# Patient Record
Sex: Female | Born: 1965 | Race: Black or African American | Hispanic: No | Smoking: Never smoker
Health system: Southern US, Community
[De-identification: ages and names within clinical notes are randomized; demographics above are authoritative.]

## PROBLEM LIST (undated history)

## (undated) DIAGNOSIS — R7303 Prediabetes: Secondary | ICD-10-CM

## (undated) HISTORY — PX: ACHILLES TENDON REPAIR: SUR1153

## (undated) HISTORY — PX: ABDOMINAL HYSTERECTOMY: SHX81

## (undated) HISTORY — PX: KNEE SURGERY: SHX244

---

## 1998-07-30 ENCOUNTER — Inpatient Hospital Stay (HOSPITAL_COMMUNITY): Admission: AD | Admit: 1998-07-30 | Discharge: 1998-07-30 | Payer: Self-pay | Admitting: *Deleted

## 1998-08-19 ENCOUNTER — Other Ambulatory Visit: Admission: RE | Admit: 1998-08-19 | Discharge: 1998-08-19 | Payer: Self-pay | Admitting: Obstetrics and Gynecology

## 1999-01-08 ENCOUNTER — Inpatient Hospital Stay (HOSPITAL_COMMUNITY): Admission: AD | Admit: 1999-01-08 | Discharge: 1999-01-11 | Payer: Self-pay | Admitting: Obstetrics and Gynecology

## 1999-01-24 ENCOUNTER — Inpatient Hospital Stay (HOSPITAL_COMMUNITY): Admission: AD | Admit: 1999-01-24 | Discharge: 1999-01-24 | Payer: Self-pay | Admitting: Obstetrics & Gynecology

## 2001-07-20 ENCOUNTER — Other Ambulatory Visit: Admission: RE | Admit: 2001-07-20 | Discharge: 2001-07-20 | Payer: Self-pay | Admitting: Obstetrics & Gynecology

## 2002-01-20 ENCOUNTER — Inpatient Hospital Stay (HOSPITAL_COMMUNITY): Admission: AD | Admit: 2002-01-20 | Discharge: 2002-01-20 | Payer: Self-pay | Admitting: Obstetrics and Gynecology

## 2002-01-22 ENCOUNTER — Inpatient Hospital Stay (HOSPITAL_COMMUNITY): Admission: AD | Admit: 2002-01-22 | Discharge: 2002-01-22 | Payer: Self-pay | Admitting: Obstetrics and Gynecology

## 2002-02-17 ENCOUNTER — Inpatient Hospital Stay (HOSPITAL_COMMUNITY): Admission: AD | Admit: 2002-02-17 | Discharge: 2002-02-20 | Payer: Self-pay | Admitting: Obstetrics & Gynecology

## 2002-02-17 ENCOUNTER — Encounter (INDEPENDENT_AMBULATORY_CARE_PROVIDER_SITE_OTHER): Payer: Self-pay

## 2002-04-08 ENCOUNTER — Inpatient Hospital Stay (HOSPITAL_COMMUNITY): Admission: AD | Admit: 2002-04-08 | Discharge: 2002-04-08 | Payer: Self-pay | Admitting: Obstetrics and Gynecology

## 2002-09-06 ENCOUNTER — Emergency Department (HOSPITAL_COMMUNITY): Admission: EM | Admit: 2002-09-06 | Discharge: 2002-09-06 | Payer: Self-pay | Admitting: Emergency Medicine

## 2007-01-05 ENCOUNTER — Observation Stay (HOSPITAL_COMMUNITY): Admission: EM | Admit: 2007-01-05 | Discharge: 2007-01-07 | Payer: Self-pay | Admitting: Emergency Medicine

## 2007-01-05 ENCOUNTER — Ambulatory Visit: Payer: Self-pay | Admitting: Internal Medicine

## 2008-02-16 ENCOUNTER — Emergency Department (HOSPITAL_BASED_OUTPATIENT_CLINIC_OR_DEPARTMENT_OTHER): Admission: EM | Admit: 2008-02-16 | Discharge: 2008-02-16 | Payer: Self-pay | Admitting: Emergency Medicine

## 2008-08-01 ENCOUNTER — Emergency Department (HOSPITAL_BASED_OUTPATIENT_CLINIC_OR_DEPARTMENT_OTHER): Admission: EM | Admit: 2008-08-01 | Discharge: 2008-08-01 | Payer: Self-pay | Admitting: Emergency Medicine

## 2009-10-25 ENCOUNTER — Other Ambulatory Visit: Admission: RE | Admit: 2009-10-25 | Discharge: 2009-10-25 | Payer: Self-pay | Admitting: Obstetrics and Gynecology

## 2009-10-25 ENCOUNTER — Ambulatory Visit: Payer: Self-pay | Admitting: Obstetrics and Gynecology

## 2009-10-25 ENCOUNTER — Encounter: Payer: Self-pay | Admitting: Obstetrics & Gynecology

## 2009-10-25 LAB — CONVERTED CEMR LAB
HCT: 35.8 % — ABNORMAL LOW (ref 36.0–46.0)
Hemoglobin: 10.2 g/dL — ABNORMAL LOW (ref 12.0–15.0)
MCHC: 28.5 g/dL — ABNORMAL LOW (ref 30.0–36.0)
MCV: 70.2 fL — ABNORMAL LOW (ref 78.0–100.0)
Platelets: 415 10*3/uL — ABNORMAL HIGH (ref 150–400)
RBC: 5.1 M/uL (ref 3.87–5.11)
RDW: 17.8 % — ABNORMAL HIGH (ref 11.5–15.5)
TSH: 1.073 microintl units/mL (ref 0.350–4.500)
WBC: 5.8 10*3/uL (ref 4.0–10.5)

## 2009-10-28 ENCOUNTER — Ambulatory Visit (HOSPITAL_COMMUNITY): Admission: RE | Admit: 2009-10-28 | Discharge: 2009-10-28 | Payer: Self-pay | Admitting: Obstetrics and Gynecology

## 2009-11-08 ENCOUNTER — Ambulatory Visit: Payer: Self-pay | Admitting: Obstetrics & Gynecology

## 2009-11-29 ENCOUNTER — Ambulatory Visit: Payer: Self-pay | Admitting: Obstetrics & Gynecology

## 2010-10-28 NOTE — Consult Note (Signed)
Lindsay Gonzalez, SUKUP NO.:  [id number]   MEDICAL RECORD NO.:  [id number]          PATIENT TYPE:  INP   LOCATION:  4736                         FACILITY:  [facility]   PHYSICIAN:  Bevelyn Buckles. Bensimhon, MDDATE OF BIRTH:  1966/02/26   DATE OF CONSULTATION:  01/06/2007  DATE OF DISCHARGE:                                 CONSULTATION   PRIMARY CARE PHYSICIAN:  Dr.  Ashley Royalty.   REQUESTING PHYSICIAN:  [facility].   CARDIOLOGIST:  Bevelyn Buckles. Bensimhon, M.D.   REASON FOR CONSULTATION:  Chest pain.   HISTORY OF PRESENT ILLNESS:  This is a 45 year old obese female with  complaints of chest pain ongoing on and off for the last 6 to 7 months,  which has gotten worse over the past 3 weeks.  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  She  felt weak and nauseated while she was standing doing someone's hair.  The patient states it lasted approximately 2 hours.  Apparently, this  patient has been having this same similar chest discomfort over the last  6 to 7 months that has been coming more often and lasting anywhere from  1 to 2 hours.  The patient states she felt nauseated.  Also, felt her  heart fluttering and her heart beating very fast and pounding in her  ears.  There is no aggravating or relieving factors.  Usually the pain  in the past has gone away on its own.  As a result of this recurrent  chest discomfort, the patient drove to [facility] emergency room.  She  was given 4 baby aspirin in the ER, placed on nitroglycerin paste and  the pain was relieved.  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  She has had no prior cardiac  workup in the past.  She denies any dizziness, vomiting, diarrhea,  syncopal episodes or diaphoresis.   REVIEW OF  SYSTEMS:  As above.  Otherwise, negative.   PAST MEDICAL HISTORY:  She did have a back injury in 1991.  There is no  prior history of diabetes, hypertension, hypercholesterolemia or kidney  disease.   PAST SURGICAL HISTORY:  Knee surgeries, left Achilles tendon repair and  3 C-sections.   SOCIAL HISTORY:  The patient lives in [place] with her 3 children.  She  is divorced.  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  She has  never smoked.  Occasional alcohol.  She walks the track on the weekends  with her children.   FAMILY HISTORY:  Mother with angina.  Father with diabetes,  hypertension, and kidney disease.  She has 1 sister with hypertension.   CURRENT MEDICATIONS AT HOME:  Ibuprofen p.r.n.   CURRENT MEDICATIONS WHILE HOSPITALIZED:  1. Protonix 40 mg once a day.  2. Nitroglycerin p.r.n.   ALLERGIES:  PENICILLIN.  CURRENT LABS:  Hemoglobin 8.4, hematocrit 28.3, white blood cells 5.4,  platelets 467, sodium 140, potassium 3.7, chloride 109, CO2 26, BUN 6,  creatinine 1.0, glucose 91, CK 10.5, MB 1.0, troponin 0.01 and 0.01,  respectively.  PT 13.1, INR 1.0, cholesterol 137, triglycerides 48, HDL  30, LDL 97.  CT of the chest was negative for PE dissection.  No acute  findings.  1 to 2 low density areas are involving the distal pancreas  with recommendations for follow up CT in 6 months.  Chest x-ray  revealing no acute abnormalities.  EKG revealing normal sinus rhythm  with ventricular rate of 72 beats per minute.   PHYSICAL EXAMINATION:  VITAL SIGNS:  Blood pressure 108/71, pulse 80,  respirations 24, saturation 100% on room air, temperature 97.0.  HEENT:  Head is normocephalic, atraumatic.  Eyes:  PERRLA.  Mucous  membranes of the mouth pink and moist.  Tongue is midline.  NECK:  Supple.  There is no JVD.  No carotid bruits.  No thyromegaly  noted.  CARDIOVASCULAR:  Regular rate and rhythm with S1 and S2 with 2+ equal  pulses.  No bruits appreciated.  LUNGS:   Clear to auscultation without wheezes, rales or rhonchi.  ABDOMEN:  Soft and nontender.  No rebound or guarding.  EXTREMITIES:  Without clubbing, cyanosis or edema.  MUSCULOSKELETAL:  No joint deformity, effusions, or tenderness.  NEURO:  Cranial nerves II-XII are grossly intact.   IMPRESSION:  1. Chest pain.  Cardiac enzymes are negative x2.  The chest pain is      typical and atypical with a negative EKG and enzymes.  2. Microcytic anemia.  Recommend iron studies with CBC with diff,      Hemoccult stools, rule out thalassemia, sickle cell, blood loss.      The patient does mention that she has heavy periods.   PLAN:  This is a 45 year old obese female we are following at the  request of [facility] for recurrent chest pain.  She was seen and  examined by myself and Dr. Arvilla Meres in her room.  The chest pain  is typical and atypical with negative EKG and enzymes.  Given her age  and lack of risk factors, suspicion for CAD is fairly low.  We will plan  an exercise stress test and Myoview in the morning for diagnostic  prognostic purposes.  She will need an anemia work up and we will follow  with you making further recommendations based upon test results and  patient's response to medications.      Bettey Mare. Lyman Bishop, NP      Bevelyn Buckles. Bensimhon, MD  Electronically Signed    KML/MEDQ  D:  01/06/2007  T:  01/06/2007  Job:  [id number]   cc:   Dr. Ashley Royalty

## 2010-10-28 NOTE — Discharge Summary (Signed)
Lindsay Gonzalez, Lindsay Gonzalez             ACCOUNT NO.:  [id number]   MEDICAL RECORD NO.:  [id number]          PATIENT TYPE:  INP   LOCATION:  4736                         FACILITY:  [facility]   PHYSICIAN:  Ladell Pier, M.D.   DATE OF BIRTH:  10/02/1965   DATE OF ADMISSION:  01/05/2007  DATE OF DISCHARGE:  01/07/2007                               DISCHARGE SUMMARY   DISCHARGE DIAGNOSES:  1. Chest pain with negative stress test.  2. Microcytic anemia, most likely secondary to chronic blood loss from      GYN etiology.  3. Gastroesophageal reflux disease.  4. Pancreatic cyst.  Patient to follow up with repeat CT scan or MR in      6 months.   DISCHARGE MEDICATIONS:  1. Prilosec OTC daily.  2. Iron sulfate b.i.d.   PROCEDURE:  She had a stress test done January 06, 2007.   CONSULTATIONS:  Cardiology, [place].   HISTORY OF PRESENT ILLNESS:  The patient is a 45 year old female that  was admitted with chest pain for 3 weeks.  She states that the pain  lasted for approximately 2 hours.  She states that she has been having  chest pain over the last 6-7 months.  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  She has no cardiac  history.  She denies any dizziness, vomiting, diarrhea, syncopal episode  or diaphoresis.   PAST MEDICAL HISTORY:  Per admission H&P.   FAMILY HISTORY:  Per admission H&P.   SOCIAL HISTORY:  Per admission H&P.   MEDICATIONS:  Per admission H&P.   ALLERGIES:  Per admission H&P.   REVIEW OF SYSTEMS:  Per admission H&P.   PHYSICAL EXAMINATION ON DISCHARGE:  VITAL SIGNS:  Temperature 97.6.  Pulse of 66.  Respirations 18.  Blood pressure 98/67.  Pulse oximetry  99% on room air.  HEENT:  Head is normocephalic, atraumatic.  Pupils reactive to light.  Throat without erythema.  CARDIOVASCULAR:  Regular rate and rhythm.  LUNGS:  Clear bilaterally.  ABDOMEN:   Positive bowel sounds.  EXTREMITIES:  Without edema.   HOSPITAL COURSE:  1. Chest pain:  Patient was admitted to the telemetry floor.      Observation for chest pain continued.  Cardiology was consulted and      she had a mild perfusion stress test that was normal.  She will      followup outpatient with Dr. Loreta Ave to rule out a gastrointestinal      etiology of her pain.  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  2. Microcytic anemia:  The patient does have microcytic anemia.  She      states that she does have very heavy menses with clots all the      time.  She thought she was anemic from that.  She has  not been to      the GYN secondary to lack of insurance.  Encouraged patient to      follow up with GYN and to start taking iron 325 mg twice daily.   DISCHARGE LABS:  Iron 17, TIBC 296, percent sat 6, ferritin up 1.  WBC  5.7, hemoglobin 8.5, platelets 425.  Folate 6.2.  Iron less than 10.  Heptoglobin 83.  Cardiac enzymes all normal.  Lipid profile:  Total  cholesterol 137, triglycerides 48, HDL 30, LDL 97.  D-dimer normal at  0.30.  Myoperfusion study showed negative for ischemia or fixed  perfusion defects.  LV systolic function of 69%.  CT angiogram of the  chest, showed no acute chest findings.  A small, 1 or 2 small low  density air involving the distal pancreas.  This may represent a small  cyst, but there are indeterminate.  Recommend a 6 month followup CT to  insure stability.      Ladell Pier, M.D.  Electronically Signed     N[place]/MEDQ  D:  01/07/2007  T:  01/07/2007  Job:  [id number]   cc:   Altha Harm, MD

## 2010-10-28 NOTE — H&P (Signed)
NAME:  Lindsay Gonzalez, ZAGAL NO.:  [id number]   MEDICAL RECORD NO.:  [id number]          PATIENT TYPE:  EMS   LOCATION:  MAJO                         FACILITY:  [facility]   PHYSICIAN:  Michaelyn Barter, M.D. DATE OF BIRTH:  1965-11-27   DATE OF ADMISSION:  01/05/2007  DATE OF DISCHARGE:                              HISTORY & PHYSICAL   PRIMARY CARE DOCTOR:  Unassigned.   CHIEF COMPLAINT:  Chest pain.   HISTORY OF PRESENT ILLNESS:  The patient is a 45 year old female who  states that over the past 6 months she has been experiencing chest pain.  This morning at approximately 9 o'clock a.m. while at work, she works at  [employer], she was standing up doing a customer's hair when she  began to develop chest pain.  The pain is centrally located.  She  describes it as a stabbing pressure reaching approximately 7/10.  She  states that the pain occurs almost every day, however, today she has  been feeling a bit more weak than previously.  There is occasional  radiation of the chest pain to her back.  She denies having any  radiation to her neck or down her arm.  There are no aggravating or  alleviating factors for the patient's chest pain.  The pain usually  remits spontaneously.  Today it has been occurring off and on throughout  the the day.  She denies presently having shortness of breath.  She did  have some nausea but denies emesis.  No fevers or chills.  No orthopnea,  no PND.  She does snore.  She has never had a stress test before.  Currently she denies having any chest pain per se but states that she  feels a fullness in her chest.   PAST MEDICAL HISTORY:  No illnesses.   PAST SURGICAL HISTORY:  1. Three left knee surgeries.  2. Left Achilles tendon surgery.  3. Three c-sections.   ALLERGIES:  PENICILLIN causes swelling.   HOME MEDICATIONS:  Ibuprofen p.r.n.   SOCIAL HISTORY:  1. Cigarettes never.  2. Alcohol occasionally.  3. Cocaine never.  4. Employed  as a Interior and spatial designer.   FAMILY HISTORY:  Mother had questionable angina, father has diabetes  mellitus and hypertension.   PHYSICAL EXAM:  The patient is awake, she is cooperative, she is no  obvious respiratory distress.  VITALS:  Her blood pressure is 148/75, heart rate 79, respirations 18,  pulse ox 98%.  HEENT:  Normocephalic, atraumatic, anicteric, extraocular movements are  intact.  Pupils are equally reactive to light bilaterally.  Oral mucosa  is pink, no thrush, no exudate.  NECK:  Supple, neck veins are slightly prominent, no lymphadenopathy, no  thyromegaly.  Carotid upstroke is strong bilaterally, no carotid bruit.  CARDIAC:  S1, S2 present, regular rate and rhythm although heart sounds  are somewhat distant.  RESPIRATORY:  No crackles, no wheezes.  CHEST:  There is no reproducible chest pain to palpation.  ABDOMEN:  Flat, soft, nontender, nondistended, no masses, no  hepatosplenomegaly.  Bowel sounds are present x4 quadrants.  EXTREMITIES:  No leg edema.  NEUROLOGICALLY:  The patient is alert and oriented x3.  Cranial nerves  II-XII are intact.  MUSCULOSKELETAL:  5/5 upper and lower extremity strength.   LABS:  Troponin-I, POC less than 0.05 x3.  CKMB, POC less than 1 x3.  D-  dimer 0.30, PT 13.1, INR 1.0, creatinine 1.0, pH of 7.341, PCO2 49.7,  bicarb 26.9, hemoglobin 13.6, hematocrit 40.0, sodium 141, potassium  3.8, chloride 106, glucose 95, BUN 8.  EKG reveals normal sinus rhythm,  no Q-waves or ST segment changes.  Chest x-ray reveals no acute  abnormalities.   ASSESSMENT AND PLAN:  1. Chest pain.  The patient's current chest pain does have some      atypical features in that it has been occurring every single day      for the past 6 months.  The etiology of which however is      questionable.  Whether or not this is cardiac versus an other cause      is also questionable, although the patient's risk factors appear to      be limited in that she denies cigarettes,  there is no family      history of myocardial infarction.  Will rule the patient out  for a      cardiac eventy by checking troponin-I and CKMBs x3.  Will also      check a 2D echocardiogram as well as a fasting lipid profile.  Will      provide the patient with as needed morphine, oxygen, aspirin and      nitroglycerin.  In light of the patient indicating that the pain      travels directly to her back, will get a CT scan of the patient's      chest to make sure that the patient does not have a dissection      present.  2. Gastrointestinal prophylaxis.  Will provide Protonix.  3. Deep venous thrombosis prophylaxis.  Will provide Lovenox.      Michaelyn Barter, M.D.  Electronically Signed     OR/MEDQ  D:  01/05/2007  T:  01/05/2007  Job:  [id number]

## 2010-10-31 NOTE — Op Note (Signed)
NAME:  Lindsay Gonzalez, Lindsay Gonzalez                       ACCOUNT NO.:  [id number]   MEDICAL RECORD NO.:  [id number]                   PATIENT TYPE:  INP   LOCATION:  9124                                 FACILITY:  [facility]   PHYSICIAN:  Ilda Mori, M.D.                DATE OF BIRTH:  [birth year]   DATE OF PROCEDURE:  02/17/2002  DATE OF DISCHARGE:                                 OPERATIVE REPORT   PREOPERATIVE DIAGNOSES:  Previous cesarean section x2.   POSTOPERATIVE DIAGNOSES:  Previous cesarean section x2, term birth living  female child, partial dehiscence of cesarean section scar in lower segment.   PROCEDURE:  Repeat low transverse cesarean section, bilateral partial  salpingectomy for sterilization.   SURGEON:  Ilda Mori, M.D.   ASSISTANT:  Randye Lobo, M.D.   ANESTHESIA:  Spinal.   ESTIMATED BLOOD LOSS:  500 cc.   FINDINGS:  Female infant weighing 7 pounds 7 ounces.  Apgar scores 9 and 9.  Almost total dehiscence of lower segment scar with fetal parts visible  through the window in the lower segment.   INDICATIONS:  This is a 45 year old gravida 5, para 2, AB2 who was admitted  for repeat low transverse cesarean section.  The patient's prenatal course  was uncomplicated.  At her 37 week visit she was scheduled for surgery and  at that point she requested permanent sterilization.  On admission to the  hospital she had decided not to have her tubes tied.   PROCEDURE:  The patient was taken to the operating room and the abdomen was  prepped and spinal anesthesia was placed.  The abdomen was then prepped and  draped in a sterile fashion.  A low transverse incision was made through the  previous laparotomy scar.  The incision was carried down through the fascia  and the anterior rectus sheath was dissected free from the underlying rectus  muscle.  The rectus muscle was then divided in the midline and the  peritoneum was entered sharply and extended by sharp and blunt  dissection.  The lower segment was identified and a window was noted to be present with  the fetal hand could be seen through the lower segment window.  Metzenbaum  scissors were used to enter the uterine cavity and the lower segment was  extended bluntly in a transverse fashion.  The infant was delivered with the  aid of a vacuum extractor.  A single nuchal cord was noted.  Cord bloods  were obtained.  The placenta was delivered with traction on the cord.  The  membranes were removed with blunt curettage.  The lower segment which was  paper thin was closed with running interlocking 0 Vicryl suture.  At this  point the patient really had no sedation and was well alert and was informed  of the window that had been noted in the lower segment and the risk of  rupture in  subsequent pregnancies.  In view of the fact that the patient had  previously expressed the wish for tubal ligation, that she had two living  children at the time plus an apparently healthy baby at the time of this  delivery, the option of having her tubes tied was revisited.  This was done  mainly for medical indications with the fear of maternal and fetal problems  if the patient elected to attempt carrying another pregnancy to term.  At  that time it was also discussed with the patient that birth control pills,  Depo-Provera shots, and oral contraceptives were all forms of nonpermanent  birth control that could be used.  The patient at this point elected to  proceed with tubal ligation.  The anesthetist and circulating nurse were  informed.  They all felt comfortable, as well, with the decision to proceed  with sterilization as being appropriate based upon the surgical findings and  the informed consent given to the patient and the patient's response.  Therefore, at this time the fallopian tubes were grasped with Babcock clamp  and doubly ligated with 0 plain catgut suture and a knuckle of tube was  excised for pathological  confirmation.  The peritoneum and rectus muscle was  closed in the midline with running 3-0 Vicryl suture.  The fascia was closed  in a transverse fashion with 0 Vicryl suture.  The scar tissue and  subcutaneous tissue was opened with cutting cautery and the skin as closed  with staples.  The patient tolerated the procedure well and left the  operating room in good condition.                                               Ilda Mori, M.D.    RK/MEDQ  D:  02/17/2002  T:  02/17/2002  Job:  [id number]

## 2011-03-30 LAB — POCT I-STAT CREATININE
Creatinine, Ser: 1
Operator id: 285841

## 2011-03-30 LAB — IRON AND TIBC
Iron: 17 — ABNORMAL LOW
Saturation Ratios: 6 — ABNORMAL LOW
TIBC: 296
UIBC: 279

## 2011-03-30 LAB — CBC
HCT: 28.3 — ABNORMAL LOW
HCT: 28.8 — ABNORMAL LOW
Hemoglobin: 8.4 — ABNORMAL LOW
Hemoglobin: 8.5 — ABNORMAL LOW
MCHC: 29.5 — ABNORMAL LOW
MCHC: 29.6 — ABNORMAL LOW
MCV: 61.3 — ABNORMAL LOW
MCV: 61.8 — ABNORMAL LOW
Platelets: 425 — ABNORMAL HIGH
Platelets: 467 — ABNORMAL HIGH
RBC: 4.62
RBC: 4.66
RDW: 18.6 — ABNORMAL HIGH
RDW: 19.1 — ABNORMAL HIGH
WBC: 5.4
WBC: 5.7

## 2011-03-30 LAB — LIPID PANEL
Cholesterol: 137
HDL: 30 — ABNORMAL LOW
LDL Cholesterol: 97
Total CHOL/HDL Ratio: 4.6
Triglycerides: 48
VLDL: 10

## 2011-03-30 LAB — POCT CARDIAC MARKERS
CKMB, poc: <1 — ABNORMAL LOW
CKMB, poc: <1 — ABNORMAL LOW
CKMB, poc: <1 — ABNORMAL LOW
Myoglobin, poc: 50.1
Myoglobin, poc: 55.5
Myoglobin, poc: 60.8
Operator id: 272551
Operator id: 285841
Operator id: 285841
Troponin i, poc: <0.05
Troponin i, poc: <0.05
Troponin i, poc: <0.05

## 2011-03-30 LAB — I-STAT 8, (EC8 V) (CONVERTED LAB)
BUN: 8
Bicarbonate: 26.9 — ABNORMAL HIGH
Chloride: 106
Glucose, Bld: 95
HCT: 40
Hemoglobin: 13.6
Operator id: 285841
Potassium: 3.8
Sodium: 141
TCO2: 28
pCO2, Ven: 49.7
pH, Ven: 7.341 — ABNORMAL HIGH

## 2011-03-30 LAB — FOLATE: Folate: 6.2

## 2011-03-30 LAB — BASIC METABOLIC PANEL
BUN: 6
CO2: 26
Calcium: 8.6
Chloride: 109
Creatinine, Ser: 1.06
GFR calc Af Amer: >60
GFR calc non Af Amer: 57 — ABNORMAL LOW
Glucose, Bld: 91
Potassium: 3.7
Sodium: 140

## 2011-03-30 LAB — PROTIME-INR
INR: 1
Prothrombin Time: 13.1

## 2011-03-30 LAB — DIFFERENTIAL
Basophils Absolute: 0
Basophils Relative: 1
Eosinophils Absolute: 0.1
Eosinophils Relative: 1
Lymphocytes Relative: 42
Lymphs Abs: 2.4
Monocytes Absolute: 0.4
Monocytes Relative: 8
Neutro Abs: 2.8
Neutrophils Relative %: 49

## 2011-03-30 LAB — HAPTOGLOBIN: Haptoglobin: 83

## 2011-03-30 LAB — CARDIAC PANEL(CRET KIN+CKTOT+MB+TROPI)
CK, MB: 0.9
CK, MB: 0.9
Relative Index: INVALID
Relative Index: INVALID
Total CK: 93
Total CK: 96
Troponin I: 0.01
Troponin I: <0.01

## 2011-03-30 LAB — CK TOTAL AND CKMB (NOT AT ARMC)
CK, MB: 1
Relative Index: 1
Total CK: 105

## 2011-03-30 LAB — FERRITIN: Ferritin: 1 — ABNORMAL LOW (ref 10–291)

## 2011-03-30 LAB — D-DIMER, QUANTITATIVE (NOT AT ARMC): D-Dimer, Quant: 0.3

## 2011-03-30 LAB — IRON: Iron: <10 — ABNORMAL LOW

## 2011-03-30 LAB — TROPONIN I: Troponin I: <0.01

## 2011-10-17 ENCOUNTER — Other Ambulatory Visit: Payer: Self-pay | Admitting: Family

## 2013-01-23 ENCOUNTER — Other Ambulatory Visit: Payer: Self-pay | Admitting: Family

## 2013-09-03 ENCOUNTER — Other Ambulatory Visit: Payer: Self-pay | Admitting: Family

## 2013-09-03 MED ORDER — INTEGRA F 125-1 MG PO CAPS
1.0000 | ORAL_CAPSULE | Freq: Every day | ORAL | Status: DC
Start: 2013-09-03 — End: 2019-10-30

## 2014-06-25 ENCOUNTER — Ambulatory Visit: Payer: Self-pay | Admitting: Obstetrics & Gynecology

## 2014-07-24 ENCOUNTER — Other Ambulatory Visit: Payer: Self-pay | Admitting: Family

## 2014-11-25 ENCOUNTER — Other Ambulatory Visit: Payer: Self-pay | Admitting: Family

## 2014-11-25 MED ORDER — ACYCLOVIR 200 MG PO CAPS: ORAL_CAPSULE | ORAL | Status: DC

## 2014-11-25 NOTE — Progress Notes (Signed)
Pt called regarding outbreak of herpes lesions and desires medication called in.  RX Acyclovir sent to [facility].

## 2017-09-26 ENCOUNTER — Emergency Department (HOSPITAL_BASED_OUTPATIENT_CLINIC_OR_DEPARTMENT_OTHER): Payer: Medicaid Other

## 2017-09-26 ENCOUNTER — Other Ambulatory Visit: Payer: Self-pay

## 2017-09-26 ENCOUNTER — Encounter (HOSPITAL_BASED_OUTPATIENT_CLINIC_OR_DEPARTMENT_OTHER): Payer: Self-pay | Admitting: *Deleted

## 2017-09-26 ENCOUNTER — Emergency Department (HOSPITAL_BASED_OUTPATIENT_CLINIC_OR_DEPARTMENT_OTHER)
Admission: EM | Admit: 2017-09-26 | Discharge: 2017-09-26 | Disposition: A | Discharge status: Home / Self Care | Payer: Medicaid Other | Attending: Emergency Medicine | Admitting: Emergency Medicine

## 2017-09-26 DIAGNOSIS — M79601 Pain in right arm: Secondary | ICD-10-CM | POA: Insufficient documentation

## 2017-09-26 DIAGNOSIS — M25521 Pain in right elbow: Secondary | ICD-10-CM | POA: Diagnosis not present

## 2017-09-26 DIAGNOSIS — M7989 Other specified soft tissue disorders: Secondary | ICD-10-CM | POA: Diagnosis not present

## 2017-09-26 DIAGNOSIS — Y9389 Activity, other specified: Secondary | ICD-10-CM | POA: Insufficient documentation

## 2017-09-26 DIAGNOSIS — Z79899 Other long term (current) drug therapy: Secondary | ICD-10-CM | POA: Diagnosis not present

## 2017-09-26 DIAGNOSIS — X501XXA Overexertion from prolonged static or awkward postures, initial encounter: Secondary | ICD-10-CM | POA: Insufficient documentation

## 2017-09-26 DIAGNOSIS — Y9289 Other specified places as the place of occurrence of the external cause: Secondary | ICD-10-CM | POA: Diagnosis not present

## 2017-09-26 DIAGNOSIS — Y999 Unspecified external cause status: Secondary | ICD-10-CM | POA: Diagnosis not present

## 2017-09-26 MED ORDER — METHOCARBAMOL 500 MG PO TABS: 500.0000 mg | ORAL_TABLET | Freq: Two times a day (BID) | ORAL | 0 refills | Status: DC

## 2017-09-26 NOTE — ED Provider Notes (Signed)
[facility] EMERGENCY DEPARTMENT Provider Note   CSN: [id number] Arrival date & time: 09/26/17  1856     History   Chief Complaint Chief Complaint  Patient presents with  . Arm Pain    HPI Lindsay Gonzalez is a 52 y.o. female who presents for evaluation of right arm pain times 2 weeks.  Patient reports that she is having pain to the medial aspect of the right elbow.  She denies any preceding trauma, injury, fall.  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  Patient reports that she has not noticed any redness or swelling overlying the arm.  She reports pain is worse with movement but is able to move it without difficulty.  Patient has not had any fevers.  Patient not taking any medications for the pain.  Patient denies any fevers, numbness/weakness, color change, warmth, SOB, CP.   The history is provided by the patient.    History reviewed. No pertinent past medical history.  There are no active problems to display for this patient.   Past Surgical History:  Procedure Laterality Date  . ABDOMINAL HYSTERECTOMY    . ACHILLES TENDON REPAIR Left   . CESAREAN SECTION    . KNEE SURGERY     x 3. left knee.     OB History   None      Home Medications    Prior to Admission medications   Medication Sig Start Date End Date Taking? Authorizing Provider  acyclovir (ZOVIRAX) 200 MG capsule Take two tablets three times a day for 5 days for each outbreak. 11/25/14   Marlis Edelson, CNM  Fe Fum-FePoly-FA-Vit C-Vit B3 (INTEGRA F) 125-1 MG CAPS Take 1 tablet by mouth daily. 09/03/13   Marlis Edelson, CNM  methocarbamol (ROBAXIN) 500 MG tablet Take 1 tablet (500 mg total) by mouth 2 (two) times daily. 09/26/17   Maxwell Caul, PA-C    Family History History reviewed. No pertinent family history.  Social History Social History   Tobacco Use  . Smoking status: Never Smoker    Substance Use Topics  . Alcohol use: Not Currently  . Drug use: Not Currently     Allergies   Penicillins   Review of Systems Review of Systems  Constitutional: Negative for fever.  Musculoskeletal:       Right arm pain  Skin: Negative for color change and wound.  Neurological: Negative for weakness and numbness.     Physical Exam Updated Vital Signs BP 125/79 (BP Location: Left Arm)   Pulse 74   Temp 98.1 F (36.7 C) (Oral)   Resp 20   Ht 5\' 11"  (1.803 m)   Wt 113.4 kg (250 lb)   SpO2 100%   BMI 34.87 kg/m   Physical Exam  Constitutional: She appears well-developed and well-nourished.  HENT:  Head: Normocephalic and atraumatic.  Eyes: Conjunctivae and EOM are normal. Right eye exhibits no discharge. Left eye exhibits no discharge. No scleral icterus.  Cardiovascular:  Pulses:      Radial pulses are 2+ on the right side, and 2+ on the left side.  Pulmonary/Chest: Effort normal.  Musculoskeletal:  Tenderness to palpation noted to the medial aspect of the right elbow.  There is some mild overlying soft tissue swelling that is localized just to the area.  No deformity or crepitus noted.  Flexion/extension of right elbow intact without any difficulty.  No overlying warmth, erythema.  Right upper extremity is without any significant edema, erythema, warmth.  No tenderness palpation to right wrist, right shoulder.  Patient able to flex and extend wrist without any difficulty.  Good range of motion of right shoulder without any difficulty.  No abnormalities of the left upper extremity.  Neurological: She is alert.  Sensation intact along major nerve distributions of BUE  Skin: Skin is warm and dry. Capillary refill takes less than 2 seconds.  Good distal cap refill.  RUE is not dusky in appearance or cool to touch.  Psychiatric: She has a normal mood and affect. Her speech is normal and behavior is normal.  Nursing note and vitals reviewed.    ED Treatments / Results   Labs (all labs ordered are listed, but only abnormal results are displayed) Labs Reviewed - No data to display  EKG None  Radiology Dg Elbow Complete Right  Result Date: 09/26/2017 CLINICAL DATA:  Pain EXAM: RIGHT ELBOW - COMPLETE 3+ VIEW COMPARISON:  None. FINDINGS: Frontal, lateral, and bilateral oblique views were obtained. No fracture or dislocation. No joint effusion. Probable calcific tendinosis noted along the medial distal humeral condyle. There is mild spurring along the coracoid process of the proximal ulna. 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 Mild spurring along the coracoid process of the proximal ulna. No appreciable joint space narrowing or erosion. No fracture or dislocation. Electronically Signed   By: Bretta Bang III M.D.   On: 09/26/2017 22:00   Dg Forearm Right  Result Date: 09/26/2017 CLINICAL DATA:  Pain EXAM: RIGHT FOREARM - 2 VIEW COMPARISON:  None. FINDINGS: Frontal and lateral views were obtained. No evident fracture or dislocation. No appreciable joint space narrowing or erosion. There is a minus ulnar variance. There is calcification along the medial distal humeral condyle, probably indicative of calcific tendinosis. IMPRESSION: Probable calcific tendinosis along the distal medial humeral condyle. No fracture or dislocation. No joint space narrowing. Minus ulnar variance noted. Electronically Signed   By: Bretta Bang III M.D.   On: 09/26/2017 21:58    Procedures Procedures (including critical care time)  Medications Ordered in ED Medications - No data to display   Initial Impression / Assessment and Plan / ED Course  I have reviewed the triage vital signs and the nursing notes.  Pertinent labs & imaging results that were available during my care of the patient were reviewed by me and considered in my medical decision making (see chart for details).     52 y.o. F who presents fro evaluation of RUE  pain that began 2 weeks ago. Reports pain worse with movement.  No preceding trauma, injury, fall.  Patient reports that pain is worse in the medial aspect of the right elbow.  She has noticed some mild soft tissue swelling.  No warmth, erythema.  No fevers, chest pain, difficulty breathing.  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 Patient is afebrile, non-toxic appearing, sitting comfortably on examination table. Vital signs reviewed and stable. Patient is neurovascularly intact.  On exam, patient has tenderness palpation noted to the medial aspect of the left elbow with localized soft tissue swelling.  No overlying warmth, erythema.  No deformity or crepitus noted.  Flexion/extension intact.  Right upper extremities without any global erythema, edema, warmth.  Consider tendinitis versus musculoskeletal strain versus epicondylitis.  History/physical exam is  not concerning for septic arthritis, DVT of upper extremity, acute arterial embolism.  Plan for x-ray evaluation.  X-ray reviewed.  Elbow x-ray shows calcific tendinosis along the medial distal humeral condyle.  Mild spurring noted.  No fracture or dislocation.  Forearm x-ray negative for any acute abnormalities.  I discussed results with patient.  Symptoms could be likely result of tendinitis versus epicondylitis.  We will plan to treat with supportive cares.  Patient instructed follow-up with primary care doctor in the next 2-4 days for further evaluation. Patient had ample opportunity for questions and discussion. All patient's questions were answered with full understanding. Strict return precautions discussed. Patient expresses understanding and agreement to plan.    Final Clinical Impressions(s) / ED Diagnoses   Final diagnoses:  Right arm pain    ED Discharge Orders        Ordered    methocarbamol (ROBAXIN) 500 MG tablet  2 times daily      09/26/17 2233       Rosana HoesLayden, Ziana Heyliger A, PA-C 09/27/17 0106    Tilden Fossaees, Elizabeth, MD 09/27/17 0130

## 2017-09-26 NOTE — ED Triage Notes (Signed)
Right arm pain without injury x 2 weeks.  Reports wrist and elbow pain-increased with movement.

## 2017-09-26 NOTE — Discharge Instructions (Signed)
You can take Tylenol or Ibuprofen as directed for pain. You can alternate Tylenol and Ibuprofen every 4 hours. If you take Tylenol at 1pm, then you can take Ibuprofen at 5pm. Then you can take Tylenol again at 9pm.   Take Robaxin as prescribed. This medication will make you drowsy so do not drive or drink alcohol when taking it.  Wear the sling for support and stabilization.  When you are at home make sure you are elevating and icing the arm.  [facility] for [facility].  Return to the emergency department for any fevers, redness or swelling of the arm, numbness/weakness of the arm, fevers or any other worsening or concerning symptoms.

## 2017-09-26 NOTE — ED Notes (Signed)
Pt given d/c instructions as per chart. Rx x 1 with precautions. Verbalizes understanding. No questions. 

## 2017-10-29 ENCOUNTER — Other Ambulatory Visit: Payer: Self-pay

## 2017-10-29 ENCOUNTER — Emergency Department (HOSPITAL_BASED_OUTPATIENT_CLINIC_OR_DEPARTMENT_OTHER)
Admission: EM | Admit: 2017-10-29 | Discharge: 2017-10-29 | Disposition: A | Discharge status: Home / Self Care | Payer: Medicaid Other | Attending: Emergency Medicine | Admitting: Emergency Medicine

## 2017-10-29 ENCOUNTER — Encounter (HOSPITAL_BASED_OUTPATIENT_CLINIC_OR_DEPARTMENT_OTHER): Payer: Self-pay | Admitting: *Deleted

## 2017-10-29 ENCOUNTER — Emergency Department (HOSPITAL_BASED_OUTPATIENT_CLINIC_OR_DEPARTMENT_OTHER): Payer: Medicaid Other

## 2017-10-29 DIAGNOSIS — J069 Acute upper respiratory infection, unspecified: Secondary | ICD-10-CM

## 2017-10-29 DIAGNOSIS — R059 Cough, unspecified: Secondary | ICD-10-CM

## 2017-10-29 DIAGNOSIS — R0981 Nasal congestion: Secondary | ICD-10-CM | POA: Diagnosis not present

## 2017-10-29 DIAGNOSIS — R05 Cough: Secondary | ICD-10-CM | POA: Diagnosis present

## 2017-10-29 DIAGNOSIS — Z79899 Other long term (current) drug therapy: Secondary | ICD-10-CM | POA: Diagnosis not present

## 2017-10-29 DIAGNOSIS — B9789 Other viral agents as the cause of diseases classified elsewhere: Secondary | ICD-10-CM | POA: Diagnosis not present

## 2017-10-29 MED ORDER — BENZONATATE 100 MG PO CAPS: 100.0000 mg | ORAL_CAPSULE | Freq: Three times a day (TID) | ORAL | 0 refills | Status: DC | PRN

## 2017-10-29 NOTE — ED Triage Notes (Signed)
Pt reports cough x4 days (productive cough), unsure of fevers. Has been taking Mucinex (last 3 hours ago) without relief.

## 2017-10-29 NOTE — Discharge Instructions (Signed)
It was my pleasure taking care of you today!   Your symptoms are likely due to a viral upper respiratory infection. Fortunately, we did not see evidence of serious infection and can treat your symptoms. Flonase and mucinex for nasal congestion, tessalon as needed for cough.  Rest, drink plenty of fluids to be sure you are staying hydrated.   Please follow up with your primary doctor for discussion of your diagnoses and further evaluation after today's visit if symptoms persist longer than 7 days; Return to the ER for high fevers, difficulty breathing or other concerning symptoms

## 2017-10-29 NOTE — ED Notes (Signed)
Patient transported to X-ray 

## 2017-10-30 NOTE — ED Provider Notes (Signed)
[facility] EMERGENCY DEPARTMENT Provider Note   CSN: [id number] Arrival date & time: 10/29/17  2139     History   Chief Complaint Chief Complaint  Patient presents with  . Cough    HPI Lindsay Gonzalez is a 52 y.o. female.  The history is provided by the patient and medical records. No language interpreter was used.  Cough  Pertinent negatives include no chest pain, no chills, no ear pain, no sore throat, no shortness of breath and no wheezing.  Lindsay Gonzalez is a 52 y.o. female who presents to the Emergency Department complaining of intermittently productive cough for the last 4 days.  Associated with nasal congestion.  She has been taking Mucinex with little improvement.  No fever or chills.  No known sick contacts.  Does feel like she has had allergies several days prior.  No chest pain or shortness of breath.   History reviewed. No pertinent past medical history.  There are no active problems to display for this patient.   Past Surgical History:  Procedure Laterality Date  . ABDOMINAL HYSTERECTOMY    . ACHILLES TENDON REPAIR Left   . CESAREAN SECTION    . KNEE SURGERY     x 3. left knee.     OB History   None      Home Medications    Prior to Admission medications   Medication Sig Start Date End Date Taking? Authorizing Provider  Fexofenadine HCl (MUCINEX ALLERGY PO) Take by mouth.   Yes [provider]  acyclovir (ZOVIRAX) 200 MG capsule Take two tablets three times a day for 5 days for each outbreak. 11/25/14   Marlis Edelson, CNM  benzonatate (TESSALON) 100 MG capsule Take 1 capsule (100 mg total) by mouth 3 (three) times daily as needed for cough. 10/29/17   Ward, Chase Picket, PA-C  Fe Fum-FePoly-FA-Vit C-Vit B3 (INTEGRA F) 125-1 MG CAPS Take 1 tablet by mouth daily. 09/03/13   Marlis Edelson, CNM  methocarbamol (ROBAXIN) 500 MG tablet Take 1 tablet (500 mg total) by mouth 2 (two) times daily. 09/26/17   Maxwell Caul, PA-C    Family History No family history on file.  Social History Social History   Tobacco Use  . Smoking status: Never Smoker  Substance Use Topics  . Alcohol use: Not Currently  . Drug use: Not Currently     Allergies   Penicillins   Review of Systems Review of Systems  Constitutional: Negative for chills and fever.  HENT: Positive for congestion. Negative for ear pain and sore throat.   Respiratory: Positive for cough. Negative for shortness of breath and wheezing.   Cardiovascular: Negative for chest pain.  Gastrointestinal: Negative for abdominal pain, diarrhea, nausea and vomiting.     Physical Exam Updated Vital Signs BP 106/79 (BP Location: Right Arm)   Pulse 92   Temp 98.2 F (36.8 C) (Oral)   Resp 18   Ht  (1.803 m)   Wt 113.4 kg (250 lb)   SpO2 98%   BMI 34.87 kg/m   Physical Exam  Constitutional: She is oriented to person, place, and time. She appears well-developed and well-nourished. No distress.  HENT:  Head: Normocephalic and atraumatic.  OP with erythema, no exudates or tonsillar hypertrophy. + nasal congestion with mucosal edema.   Neck: Normal range of motion. Neck supple.  Cardiovascular: Normal rate, regular rhythm and normal heart sounds.  Pulmonary/Chest: Effort normal.  Lungs are clear to  auscultation bilaterally - no w/r/r  Abdominal: Soft. She exhibits no distension. There is no tenderness.  Musculoskeletal: Normal range of motion.  Neurological: She is alert and oriented to person, place, and time.  Skin: Skin is warm and dry. She is not diaphoretic.  Nursing note and vitals reviewed.    ED Treatments / Results  Labs (all labs ordered are listed, but only abnormal results are displayed) Labs Reviewed - No data to display  EKG None  Radiology Dg Chest 2 View  Result Date: 10/29/2017 CLINICAL DATA:  Cough x 4 days w/ chest soreness. Non smoker. No htn or diab. Recent sinusitis. EXAM: CHEST - 2 VIEW COMPARISON:   Chest x-ray dated 01/05/2007 FINDINGS: Heart size and mediastinal contours are normal. Lungs are clear. No pleural effusion or pneumothorax seen. Osseous structures about the chest are unremarkable. IMPRESSION: No active cardiopulmonary disease. No evidence of pneumonia or pulmonary edema. Electronically Signed   By: Bary Richard M.D.   On: 10/29/2017 22:39    Procedures Procedures (including critical care time)  Medications Ordered in ED Medications - No data to display   Initial Impression / Assessment and Plan / ED Course  I have reviewed the triage vital signs and the nursing notes.  Pertinent labs & imaging results that were available during my care of the patient were reviewed by me and considered in my medical decision making (see chart for details).     Lindsay Gonzalez is a 52 y.o. female who presents to ED for cough, congestion.   On exam, patient is afebrile, non-toxic appearing with a clear lung exam. Mild rhinorrhea and OP with erythema but no exudates or tonsillar hypertrophy.  CXR with no acute findings.  Sxs today likely due to viral URI.Symptomatic home care instructions discussed. Rx for Tessalon given. PCP follow up strongly encouraged if symptoms persist. Reasons to return to ER discussed. All questions answered.   Blood pressure 106/79, pulse 92, temperature 98.2 F (36.8 C), temperature source Oral, resp. rate 18, height  (1.803 m), weight 113.4 kg (250 lb), SpO2 98 %.   Final Clinical Impressions(s) / ED Diagnoses   Final diagnoses:  Cough  Viral URI with cough    ED Discharge Orders        Ordered    benzonatate (TESSALON) 100 MG capsule  3 times daily PRN     10/29/17 2318       Ward, Chase Picket, PA-C 10/30/17 1610    Tilden Fossa, MD 10/30/17 [phone]

## 2018-02-22 ENCOUNTER — Emergency Department (HOSPITAL_BASED_OUTPATIENT_CLINIC_OR_DEPARTMENT_OTHER): Payer: Medicaid Other

## 2018-02-22 ENCOUNTER — Encounter (HOSPITAL_BASED_OUTPATIENT_CLINIC_OR_DEPARTMENT_OTHER): Payer: Self-pay

## 2018-02-22 ENCOUNTER — Emergency Department (HOSPITAL_BASED_OUTPATIENT_CLINIC_OR_DEPARTMENT_OTHER)
Admission: EM | Admit: 2018-02-22 | Discharge: 2018-02-22 | Disposition: A | Discharge status: Home / Self Care | Payer: Medicaid Other | Attending: Emergency Medicine | Admitting: Emergency Medicine

## 2018-02-22 DIAGNOSIS — K862 Cyst of pancreas: Secondary | ICD-10-CM | POA: Insufficient documentation

## 2018-02-22 DIAGNOSIS — Z79899 Other long term (current) drug therapy: Secondary | ICD-10-CM | POA: Insufficient documentation

## 2018-02-22 DIAGNOSIS — R350 Frequency of micturition: Secondary | ICD-10-CM | POA: Insufficient documentation

## 2018-02-22 DIAGNOSIS — R339 Retention of urine, unspecified: Secondary | ICD-10-CM | POA: Diagnosis present

## 2018-02-22 LAB — URINALYSIS, ROUTINE W REFLEX MICROSCOPIC
Bilirubin Urine: NEGATIVE
Glucose, UA: NEGATIVE mg/dL
Hgb urine dipstick: NEGATIVE
Ketones, ur: NEGATIVE mg/dL
Leukocytes, UA: NEGATIVE
Nitrite: NEGATIVE
Protein, ur: NEGATIVE mg/dL
Specific Gravity, Urine: <1.005 — ABNORMAL LOW (ref 1.005–1.030)
pH: 6 (ref 5.0–8.0)

## 2018-02-22 NOTE — Discharge Instructions (Addendum)
Your CT scan showed that you do not have any kidney stones or anything going on in your abdomen. 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 It is important that you follow-up with a PCP to get repeat imaging in 1 year.  Your urine did not show any infection today. I did order for an urine culture to be done. If it grows any bacteria, you will get called by one of our pharmacists regarding medication needs.  Thank you for allowing me to take care of you today! I will work on getting my ends trimmed.

## 2018-02-22 NOTE — ED Triage Notes (Signed)
Pt c/o urinary difficulty, frequency and back pain x1wk

## 2018-02-23 NOTE — ED Provider Notes (Signed)
[facility] EMERGENCY DEPARTMENT Provider Note  CSN: [id number] Arrival date & time: 02/22/18  1728   History   Chief Complaint Chief Complaint  Patient presents with  . Urinary Retention    HPI Lindsay Gonzalez is a 52 y.o. female with a reported medical history of nephrolithiasis who presented to the ED for urinary frequency and urgency x1 week. Denies dysuria, but endorses discomfort when urinating. Also complains of bilateral flank pain that is intermittent and sharp in nature. There are no precipitating factors to the pain, but rates it as 10/10 when it happens. Denies hematuria, vaginal pain, vaginal discharge or bleeding. Denies fever, chills, abdominal pain, change in bowel habits, hematochezia/melena or N/V. Patient has tried nothing for treatment prior to coming to the ED.  History reviewed. No pertinent past medical history.  There are no active problems to display for this patient.   Past Surgical History:  Procedure Laterality Date  . ABDOMINAL HYSTERECTOMY    . ACHILLES TENDON REPAIR Left   . CESAREAN SECTION    . KNEE SURGERY     x 3. left knee.     OB History   None      Home Medications    Prior to Admission medications   Medication Sig Start Date End Date Taking? Authorizing Provider  acyclovir (ZOVIRAX) 200 MG capsule Take two tablets three times a day for 5 days for each outbreak. 11/25/14   Marlis Edelson, CNM  benzonatate (TESSALON) 100 MG capsule Take 1 capsule (100 mg total) by mouth 3 (three) times daily as needed for cough. 10/29/17   Ward, Chase Picket, PA-C  Fe Fum-FePoly-FA-Vit C-Vit B3 (INTEGRA F) 125-1 MG CAPS Take 1 tablet by mouth daily. 09/03/13   Marlis Edelson, CNM  Fexofenadine HCl [facility] ALLERGY PO) Take by mouth.    [provider]  methocarbamol (ROBAXIN) 500 MG tablet Take 1 tablet (500 mg total) by mouth 2 (two) times daily. 09/26/17   Maxwell Caul, PA-C    Family History No family history on  file.  Social History Social History   Tobacco Use  . Smoking status: Never Smoker  . Smokeless tobacco: Never Used  Substance Use Topics  . Alcohol use: Not Currently  . Drug use: Not Currently     Allergies   Codeine and Penicillins   Review of Systems Review of Systems  Constitutional: Negative for appetite change, chills and fever.  Respiratory: Negative.   Cardiovascular: Negative.   Gastrointestinal: Negative for abdominal pain, constipation, diarrhea, nausea and vomiting.  Endocrine: Negative.   Genitourinary: Positive for frequency and urgency. Negative for difficulty urinating, hematuria, vaginal bleeding, vaginal discharge and vaginal pain.  Musculoskeletal: Negative.   Skin: Negative.    Physical Exam Updated Vital Signs BP (!) 142/84 (BP Location: Right Arm)   Pulse 72   Temp 98.4 F (36.9 C) (Oral)   Resp 18   Ht 5\' 11"  (1.803 m)   Wt 120.3 kg   SpO2 98%   BMI 36.98 kg/m   Physical Exam  Constitutional: She appears well-developed and well-nourished. She is cooperative. She does not have a sickly appearance. No distress.  Cardiovascular: Normal rate, regular rhythm and normal heart sounds.  No murmur heard. Pulmonary/Chest: Effort normal and breath sounds normal.  Abdominal: Soft. Normal appearance and bowel sounds are normal. She exhibits no distension. There is no tenderness. There is CVA tenderness.  Mild bilateral CVA tenderness.  Musculoskeletal:       Lumbar  back: She exhibits normal range of motion, no tenderness, no pain and no spasm.  Neurological: She is alert.  Skin: Skin is warm and intact. Capillary refill takes less than 2 seconds.  Nursing note and vitals reviewed.  ED Treatments / Results  Labs (all labs ordered are listed, but only abnormal results are displayed) Labs Reviewed  URINALYSIS, ROUTINE W REFLEX MICROSCOPIC - Abnormal; Notable for the following components:      Result Value   Color, Urine STRAW (*)    Specific  Gravity, Urine <1.005 (*)    All other components within normal limits  URINE CULTURE    EKG None  Radiology Ct Renal Stone Study  Result Date: 02/22/2018 CLINICAL DATA:  Flank pain, stone disease suspected. Patient reports acute onset of lower abdominal pain today, left greater than right. History of renal stone. Initial encounter. EXAM: CT ABDOMEN AND PELVIS WITHOUT CONTRAST TECHNIQUE: Multidetector CT imaging of the abdomen and pelvis was performed following the standard protocol without IV contrast. COMPARISON:  None. FINDINGS: Lower chest: The lung bases are clear. Hepatobiliary: Borderline hepatic steatosis. No discrete focal lesion on noncontrast exam. Gallbladder physiologically distended, no calcified stone. No biliary dilatation. Pancreas: No ductal dilatation or inflammation. Scattered, at least 4, tiny low-density lesions in the body and head all measure less than 5 mm. Spleen: Normal in size without focal abnormality. Small splenule at the hilum. Adrenals/Urinary Tract: Normal adrenal glands. No hydronephrosis or perinephric edema. Both ureters are decompressed without stone along the course. Punctate calcification in the lower right kidney appears parenchymal rather than in the renal collecting system. No other renal, ureteral, or bladder calcifications. Urinary bladder is physiologically distended without wall thickening. Stomach/Bowel: Small hiatal hernia. Stomach is nondistended. No bowel obstruction, wall thickening, or inflammatory change. Small-moderate volume of stool throughout the colon. Normal appendix. Vascular/Lymphatic: No significant vascular findings are present. No enlarged abdominal or pelvic lymph nodes. Reproductive: Post hysterectomy. Ovaries tentatively identified and normal. No adnexal mass. Other: No free air or free fluid. Tiny fat containing umbilical hernia. Musculoskeletal: There are no acute or suspicious osseous abnormalities. IMPRESSION: 1. No acute abnormality  or extra-articular apathy. 2. Punctate calcification in the lower right kidney is felt to be parenchymal rather than nonobstructing stone. 3. Multiple, at least 4, tiny low-density lesions in the pancreas all measure less than 5 mm. These are too small to accurately characterize but possibly small cysts. Recommend follow up pre and post contrast MRI/MRCP or pancreatic protocol CT in 1 year. This recommendation follows ACR consensus guidelines: Management of Incidental Pancreatic Cysts: A White Paper of the ACR Incidental Findings Committee. J Am Coll Radiol 2017;14:911-923. Electronically Signed   By: Narda Rutherford M.D.   On: 02/22/2018 21:19    Procedures Procedures (including critical care time)  Medications Ordered in ED Medications - No data to display   Initial Impression / Assessment and Plan / ED Course  Triage vital signs and the nursing notes have been reviewed.  Pertinent labs & imaging results that were available during care of the patient were reviewed and considered in medical decision making (see chart for details).  Patient presents afebrile and is well appearing with a 1 week history of urinary complaints of urinary urgency, frequency and bilateral flank pain. Physical exam significant for mild bilateral CVA tenderness and portions of patient's exam is concerning for nephrolithiasis. Will order imaging to evaluate for that. Also will order UA as UTI is probably most likely dx. Physical exam does not have  an acute abdominal findings or history that would indicate an acute intra-abdominal process such as pancreatitis, appendicitis, perforation or peritonitis.  Clinical Course as of Feb 24 1408  Tue Feb 22, 2018  1932 UA normal. Will send for culture due to pt's complaints.   [GM]  2133 CT stone showed no nephrolithiasis or obstruction in urinary tract. No acute intra-abdominal or GU findings seen that would explain patient's complaints. Incidental finding of 4 cysts on the  pancreas (~101mm) that are suggested to be benign. Recommendation is for patient to follow-up with imaging in 1 year.   [GM]    Clinical Course User Index [GM] Filippo Puls, Sharyon Medicus, PA-C    Final Clinical Impressions(s) / ED Diagnoses  1. Urinary Frequency. UTI normal. Will send urine for culture. Will have pharmacy contact patient if culture grows bacteria and patient needs antibiotic. 2. Pancreatic Cysts. Advised to establish care with PCP. Recommended imaging follow-up in 1 year.  Dispo: Home. After thorough clinical evaluation, this patient is determined to be medically stable and can be safely discharged with the previously mentioned treatment and/or outpatient follow-up/referral(s). At this time, there are no other apparent medical conditions that require further screening, evaluation or treatment.   Final diagnoses:  Urinary frequency    ED Discharge Orders    None        Reva Bores 02/23/18 1409    Gwyneth Sprout, MD 02/23/18 1609

## 2018-02-24 LAB — URINE CULTURE

## 2018-07-26 ENCOUNTER — Other Ambulatory Visit: Payer: Self-pay | Admitting: Family Medicine

## 2018-07-26 DIAGNOSIS — R1032 Left lower quadrant pain: Secondary | ICD-10-CM

## 2018-08-01 ENCOUNTER — Ambulatory Visit
Admission: RE | Admit: 2018-08-01 | Discharge: 2018-08-01 | Disposition: A | Discharge status: Home / Self Care | Payer: Medicaid Other | Source: Ambulatory Visit | Attending: Family Medicine | Admitting: Family Medicine

## 2018-08-01 DIAGNOSIS — R1032 Left lower quadrant pain: Secondary | ICD-10-CM

## 2019-01-17 ENCOUNTER — Telehealth: Payer: Self-pay | Admitting: Family

## 2019-01-17 DIAGNOSIS — N898 Other specified noninflammatory disorders of vagina: Secondary | ICD-10-CM

## 2019-01-17 MED ORDER — METRONIDAZOLE 500 MG PO TABS: 500.0000 mg | ORAL_TABLET | Freq: Two times a day (BID) | ORAL | 0 refills | Status: DC

## 2019-01-17 NOTE — Telephone Encounter (Signed)
Pt calls with report of abnormal white discharge with odor.  Last intercourse three years ago.  Denies fever or abdominal pain.  Informed patient will send RX for Flagyl to pharmacy in [facility] as requested.

## 2019-10-30 ENCOUNTER — Ambulatory Visit (INDEPENDENT_AMBULATORY_CARE_PROVIDER_SITE_OTHER): Payer: Medicaid Other | Admitting: Cardiology

## 2019-10-30 ENCOUNTER — Encounter: Payer: Self-pay | Admitting: Cardiology

## 2019-10-30 ENCOUNTER — Other Ambulatory Visit: Payer: Self-pay

## 2019-10-30 VITALS — BP 112/60 | HR 82 | Ht 71.0 in | Wt 276.0 lb

## 2019-10-30 DIAGNOSIS — R42 Dizziness and giddiness: Secondary | ICD-10-CM

## 2019-10-30 DIAGNOSIS — E785 Hyperlipidemia, unspecified: Secondary | ICD-10-CM | POA: Diagnosis not present

## 2019-10-30 DIAGNOSIS — R55 Syncope and collapse: Secondary | ICD-10-CM

## 2019-10-30 DIAGNOSIS — R002 Palpitations: Secondary | ICD-10-CM | POA: Diagnosis not present

## 2019-10-30 HISTORY — DX: Palpitations: R00.2

## 2019-10-30 NOTE — Progress Notes (Signed)
Cardiology Consultation:    Date:  10/30/2019   ID:  Lindsay Gonzalez, DOB April 03, 1966, MRN [id number]  PCP:  Elvera Bicker, [place]  Cardiologist:  Jenne Campus, MD   Referring MD: Elvera Bicker, FNP   Chief Complaint  Patient presents with  . New Patient (Initial Visit)  Episode of near syncope  History of Present Illness:    Lindsay Gonzalez is a 54 y.o. female who is being seen today for the evaluation of near syncope and palpitations at the request of Elvera Bicker, [place].  For years she has been experiencing episode of dizziness and pounding in the chest does have very rare she clearly remembered 2 episodes usually happens when she is standing up she will start feeling woozy swimmy headed and she would feel her heart beating slow but very forcefully.  Usually she is able to sit down and get better by doing it.  She never completely passed out but sometimes she feels like she can.  She is a college coaching running team and last time it happened about few weeks ago when she got episode as described above.  After that she is perfectly with it.  She sometimes feels nauseated with this sensation.  Denies have any chest pain tightness squeezing pressure burning chest. Overall she considers herself healthy.  Does not have hypertension does not have diabetes, recently she was find out to have high cholesterol however no medication has been started yet.  In spite of the fact that he is a coach she does not exercise on the regular basis  Past Medical History:  Diagnosis Date  . Palpitations 10/30/2019    Past Surgical History:  Procedure Laterality Date  . ABDOMINAL HYSTERECTOMY    . ACHILLES TENDON REPAIR Left   . CESAREAN SECTION    . KNEE SURGERY     x 3. left knee.    Current Medications: No outpatient medications have been marked as taking for the 10/30/19 encounter (Office Visit) with Park Liter, MD.     Allergies:   Penicillins and Codeine    Social History   Socioeconomic History  . Marital status: Divorced    Spouse name: Not on file  . Number of children: Not on file  . Years of education: Not on file  . Highest education level: Not on file  Occupational History  . Not on file  Tobacco Use  . Smoking status: Never Smoker  . Smokeless tobacco: Never Used  Substance and Sexual Activity  . Alcohol use: Not Currently  . Drug use: Not Currently  . Sexual activity: Not on file  Other Topics Concern  . Not on file  Social History Narrative  . Not on file   Social Determinants of Health   Financial Resource Strain:   . Difficulty of Paying Living Expenses:   Food Insecurity:   . Worried About Charity fundraiser in the Last Year:   . Arboriculturist in the Last Year:   Transportation Needs:   . Film/video editor (Medical):   Marland Kitchen Lack of Transportation (Non-Medical):   Physical Activity:   . Days of Exercise per Week:   . Minutes of Exercise per Session:   Stress:   . Feeling of Stress :   Social Connections:   . Frequency of Communication with Friends and Family:   . Frequency of Social Gatherings with Friends and Family:   . Attends Religious Services:   . Active Member of Clubs  or Organizations:   . Attends Banker Meetings:   Marland Kitchen Marital Status:      Family History: The patient's family history includes COPD in her father; Diabetes in her father; Diverticulitis in her mother; Hypertension in her father and mother; Leukemia in her father. ROS:   Please see the history of present illness.    All 14 point review of systems negative except as described per history of present illness.  EKGs/Labs/Other Studies Reviewed:    The following studies were reviewed today:   EKG:  EKG is  ordered today.  The ekg ordered today demonstrates normal sinus rhythm, normal P interval, normal QS complex duration morphology, nonspecific ST segment changes.  Recent Labs: No results found for requested  labs within last 8760 hours.  Recent Lipid Panel    Component Value Date/Time   CHOL  01/06/2007 0625    137        ATP III CLASSIFICATION:  <200     mg/dL   Desirable  413-244  mg/dL   Borderline High  >=010    mg/dL   High   TRIG 48 27/25/3664 0625   HDL 30 (L) 01/06/2007 0625   CHOLHDL 4.6 01/06/2007 0625   VLDL 10 01/06/2007 0625   LDLCALC  01/06/2007 0625    97        Total Cholesterol/HDL:CHD Risk Coronary Heart Disease Risk Table                     Men   Women  1/2 Average Risk   3.4   3.3    Physical Exam:    VS:  BP 112/60   Pulse 82   Ht 5\' 11"  (1.803 m)   Wt 276 lb (125.2 kg)   SpO2 97%   BMI 38.49 kg/m     Wt Readings from Last 3 Encounters:  10/30/19 276 lb (125.2 kg)  02/22/18 265 lb 2 oz (120.3 kg)  10/29/17 250 lb (113.4 kg)     GEN:  Well nourished, well developed in no acute distress HEENT: Normal NECK: No JVD; No carotid bruits LYMPHATICS: No lymphadenopathy CARDIAC: RRR, no murmurs, no rubs, no gallops RESPIRATORY:  Clear to auscultation without rales, wheezing or rhonchi  ABDOMEN: Soft, non-tender, non-distended MUSCULOSKELETAL:  No edema; No deformity  SKIN: Warm and dry NEUROLOGIC:  Alert and oriented x 3 PSYCHIATRIC:  Normal affect   ASSESSMENT:    1. Dizziness   2. Palpitations   3. Near syncope   4. Dyslipidemia    PLAN:    In order of problems listed above:  1. Dizziness: I will ask you to wear monitor for 2 weeks to see if we can pick up any significant arrhythmia.  However, her episodes are fairly rare.  I did talk to her about potentially purchasing apple watch to see if she can catch arrhythmia by herself.  As a part of evaluation I will schedule her to have echocardiogram to assess left ventricle ejection fraction.  We will not initiate any medications until we know exactly what kind of arrhythmia with dealing with.  We did talk about need to stay well-hydrated. 2. Palpitations: Plan as outlined above. 3. Near syncope.:   As above. 4. Dyslipidemia: I will call primary care physician to get her fasting profile. 5. I initiated conversation about healthy lifestyle which include exercises on the regular basis and good diet.  She said she will try to do that.   Medication Adjustments/Labs  and Tests Ordered: Current medicines are reviewed at length with the patient today.  Concerns regarding medicines are outlined above.  Orders Placed This Encounter  Procedures  . LONG TERM MONITOR (3-14 DAYS)  . ECHOCARDIOGRAM COMPLETE   No orders of the defined types were placed in this encounter.   Signed, Georgeanna Lea, MD, [facility]. 10/30/2019 11:59 AM    [facility]

## 2019-10-30 NOTE — Patient Instructions (Signed)
Medication Instructions:  Your physician recommends that you continue on your current medications as directed. Please refer to the Current Medication list given to you today.  *If you need a refill on your cardiac medications before your next appointment, please call your pharmacy*   Lab Work: None.  If you have labs (blood work) drawn today and your tests are completely normal, you will receive your results only by: Marland Kitchen MyChart Message (if you have MyChart) OR . A paper copy in the mail If you have any lab test that is abnormal or we need to change your treatment, we will call you to review the results.   Testing/Procedures: A zio monitor was ordered today. It will remain on for 14 days. You will then return monitor and event diary in provided box. It takes 1-2 weeks for report to be downloaded and returned to [place]. We will call you with the results. If monitor falls off or has orange flashing light, please call Zio for further instructions.    Your physician has requested that you have an echocardiogram. Echocardiography is a painless test that uses sound waves to create images of your heart. It provides your doctor with information about the size and shape of your heart and how well your heart's chambers and valves are working. This procedure takes approximately one hour. There are no restrictions for this procedure.    Follow-Up: At [facility], you and your health needs are our priority.  As part of our continuing mission to provide you with exceptional heart care, we have created designated Provider Care Teams.  These Care Teams include your primary Cardiologist (physician) and Advanced Practice Providers (APPs -  Physician Assistants and Nurse Practitioners) who all work together to provide you with the care you need, when you need it.  We recommend signing up for the patient portal called "MyChart".  Sign up information is provided on this After Visit Summary.  MyChart is used to  connect with patients for Virtual Visits (Telemedicine).  Patients are able to view lab/test results, encounter notes, upcoming appointments, etc.  Non-urgent messages can be sent to your provider as well.   To learn more about what you can do with MyChart, go to [email or web address].    Your next appointment:   6 week(s)  The format for your next appointment:   In Person  Provider:   Gypsy Balsam, MD   Other Instructions   Echocardiogram An echocardiogram is a procedure that uses painless sound waves (ultrasound) to produce an image of the heart. Images from an echocardiogram can provide important information about:  Signs of coronary artery disease (CAD).  Aneurysm detection. An aneurysm is a weak or damaged part of an artery wall that bulges out from the normal force of blood pumping through the body.  Heart size and shape. Changes in the size or shape of the heart can be associated with certain conditions, including heart failure, aneurysm, and CAD.  Heart muscle function.  Heart valve function.  Signs of a past heart attack.  Fluid buildup around the heart.  Thickening of the heart muscle.  A tumor or infectious growth around the heart valves. Tell a health care provider about:  Any allergies you have.  All medicines you are taking, including vitamins, herbs, eye drops, creams, and over-the-counter medicines.  Any blood disorders you have.  Any surgeries you have had.  Any medical conditions you have.  Whether you are pregnant or may be pregnant. What are the risks?  Generally, this is a safe procedure. However, problems may occur, including:  Allergic reaction to dye (contrast) that may be used during the procedure. What happens before the procedure? No specific preparation is needed. You may eat and drink normally. What happens during the procedure?   An IV tube may be inserted into one of your veins.  You may receive contrast through this  tube. A contrast is an injection that improves the quality of the pictures from your heart.  A gel will be applied to your chest.  A wand-like tool (transducer) will be moved over your chest. The gel will help to transmit the sound waves from the transducer.  The sound waves will harmlessly bounce off of your heart to allow the heart images to be captured in real-time motion. The images will be recorded on a computer. The procedure may vary among health care providers and hospitals. What happens after the procedure?  You may return to your normal, everyday life, including diet, activities, and medicines, unless your health care provider tells you not to do that. Summary  An echocardiogram is a procedure that uses painless sound waves (ultrasound) to produce an image of the heart.  Images from an echocardiogram can provide important information about the size and shape of your heart, heart muscle function, heart valve function, and fluid buildup around your heart.  You do not need to do anything to prepare before this procedure. You may eat and drink normally.  After the echocardiogram is completed, you may return to your normal, everyday life, unless your health care provider tells you not to do that. This information is not intended to replace advice given to you by your health care provider. Make sure you discuss any questions you have with your health care provider. Document Revised: 09/22/2018 Document Reviewed: 07/04/2016 Elsevier Patient Education  [place].

## 2019-10-30 NOTE — Addendum Note (Signed)
Addended by: Theola Sequin on: 10/30/2019 02:56 PM   Modules accepted: Orders

## 2019-11-06 ENCOUNTER — Other Ambulatory Visit: Payer: Self-pay

## 2019-11-06 ENCOUNTER — Ambulatory Visit (HOSPITAL_BASED_OUTPATIENT_CLINIC_OR_DEPARTMENT_OTHER)
Admission: RE | Admit: 2019-11-06 | Discharge: 2019-11-06 | Disposition: A | Discharge status: Home / Self Care | Payer: Medicaid Other | Source: Ambulatory Visit | Attending: Cardiology | Admitting: Cardiology

## 2019-11-06 DIAGNOSIS — R42 Dizziness and giddiness: Secondary | ICD-10-CM | POA: Diagnosis not present

## 2019-11-06 DIAGNOSIS — R002 Palpitations: Secondary | ICD-10-CM

## 2019-11-06 DIAGNOSIS — R55 Syncope and collapse: Secondary | ICD-10-CM

## 2019-11-06 NOTE — Progress Notes (Signed)
  Echocardiogram 2D Echocardiogram has been performed.  Lindsay Gonzalez A Lindsay Gonzalez 11/06/2019, 11:43 AM

## 2019-11-07 ENCOUNTER — Telehealth: Payer: Self-pay | Admitting: Cardiology

## 2019-11-07 NOTE — Telephone Encounter (Signed)
Patient states that her heart monitor has come off and she cannot get it to stick again. She states it stayed on all day yesterday but when she got in the shower today it fell off. Please advise on what Dr. Bing Matter would like her to do.

## 2019-11-08 NOTE — Telephone Encounter (Signed)
Left message for patient to return call.

## 2019-11-08 NOTE — Telephone Encounter (Signed)
Yes, she probably need to get another one.  1 day is probably not enough

## 2019-11-10 NOTE — Telephone Encounter (Signed)
Called patient informed her that we will need to send her a new one because 1 day is not enough information. Patient verbally understood. Will reach out to rep to make sure patient doesn't get charged twice.

## 2019-11-15 ENCOUNTER — Encounter: Payer: Self-pay | Admitting: Cardiology

## 2019-12-05 NOTE — Telephone Encounter (Signed)
Called patient. Informed her that I spoke with rep and a a new monitor should be overnighted to her and that she needs to send the old monitor back. Patient verbally understood no further questions.

## 2019-12-05 NOTE — Telephone Encounter (Signed)
Patient is calling to follow up in regards to the status of request for new heart monitor. Please call to discuss.

## 2019-12-15 ENCOUNTER — Ambulatory Visit (INDEPENDENT_AMBULATORY_CARE_PROVIDER_SITE_OTHER): Payer: Medicaid Other

## 2019-12-15 DIAGNOSIS — R55 Syncope and collapse: Secondary | ICD-10-CM | POA: Diagnosis not present

## 2019-12-15 DIAGNOSIS — R42 Dizziness and giddiness: Secondary | ICD-10-CM

## 2019-12-15 DIAGNOSIS — R002 Palpitations: Secondary | ICD-10-CM

## 2019-12-19 ENCOUNTER — Telehealth: Payer: Self-pay | Admitting: Cardiology

## 2019-12-19 ENCOUNTER — Ambulatory Visit: Payer: Medicaid Other | Admitting: Cardiology

## 2019-12-19 NOTE — Telephone Encounter (Signed)
Patient states that the heart monitor is not going to work for her. She states she keeps having hot flashes and it keeps falling off.

## 2019-12-20 ENCOUNTER — Ambulatory Visit: Payer: Medicaid Other | Admitting: Cardiology

## 2019-12-20 NOTE — Telephone Encounter (Signed)
I guess there is no options.  We will discuss about that during next visit

## 2019-12-21 NOTE — Telephone Encounter (Signed)
Left message for patient to return call.

## 2019-12-22 NOTE — Telephone Encounter (Signed)
Called patient she reports that she only got to wear the monitor for 1 week. I informed her this is ok to go ahead and send back since it was her second one and we will see what we can get from that one. She verbally understood. No further questions.

## 2020-01-10 ENCOUNTER — Other Ambulatory Visit: Payer: Self-pay | Admitting: Family

## 2020-01-10 DIAGNOSIS — K862 Cyst of pancreas: Secondary | ICD-10-CM

## 2020-02-22 ENCOUNTER — Other Ambulatory Visit: Payer: Self-pay

## 2020-02-22 ENCOUNTER — Ambulatory Visit
Admission: RE | Admit: 2020-02-22 | Discharge: 2020-02-22 | Disposition: A | Discharge status: Home / Self Care | Payer: Medicaid Other | Source: Ambulatory Visit | Attending: Family | Admitting: Family

## 2020-02-22 DIAGNOSIS — K862 Cyst of pancreas: Secondary | ICD-10-CM

## 2020-02-22 MED ORDER — IOPAMIDOL (ISOVUE-300) INJECTION 61%
100.0000 mL | Freq: Once | INTRAVENOUS | Status: AC | PRN
  Administered 2020-02-22: 100 mL via INTRAVENOUS

## 2020-04-30 ENCOUNTER — Emergency Department (HOSPITAL_BASED_OUTPATIENT_CLINIC_OR_DEPARTMENT_OTHER)
Admission: EM | Admit: 2020-04-30 | Discharge: 2020-04-30 | Disposition: A | Discharge status: Home / Self Care | Payer: Medicaid Other | Attending: Emergency Medicine | Admitting: Emergency Medicine

## 2020-04-30 ENCOUNTER — Encounter (HOSPITAL_BASED_OUTPATIENT_CLINIC_OR_DEPARTMENT_OTHER): Payer: Self-pay | Admitting: *Deleted

## 2020-04-30 ENCOUNTER — Other Ambulatory Visit: Payer: Self-pay

## 2020-04-30 DIAGNOSIS — M5441 Lumbago with sciatica, right side: Secondary | ICD-10-CM | POA: Diagnosis not present

## 2020-04-30 DIAGNOSIS — M79604 Pain in right leg: Secondary | ICD-10-CM | POA: Insufficient documentation

## 2020-04-30 DIAGNOSIS — M545 Low back pain, unspecified: Secondary | ICD-10-CM | POA: Diagnosis present

## 2020-04-30 LAB — URINALYSIS, MICROSCOPIC (REFLEX)

## 2020-04-30 LAB — URINALYSIS, ROUTINE W REFLEX MICROSCOPIC
Bilirubin Urine: NEGATIVE
Glucose, UA: NEGATIVE mg/dL
Ketones, ur: NEGATIVE mg/dL
Leukocytes,Ua: NEGATIVE
Nitrite: NEGATIVE
Protein, ur: NEGATIVE mg/dL
Specific Gravity, Urine: 1.015 (ref 1.005–1.030)
pH: 6 (ref 5.0–8.0)

## 2020-04-30 MED ORDER — PREDNISONE 10 MG (21) PO TBPK: ORAL_TABLET | ORAL | 0 refills | Status: DC

## 2020-04-30 MED ORDER — METHOCARBAMOL 750 MG PO TABS: 750.0000 mg | ORAL_TABLET | Freq: Two times a day (BID) | ORAL | 0 refills | Status: DC | PRN

## 2020-04-30 MED ORDER — LIDOCAINE 5 % EX PTCH: 1.0000 | MEDICATED_PATCH | CUTANEOUS | 0 refills | Status: DC

## 2020-04-30 NOTE — ED Triage Notes (Signed)
Back and right hip pain x 1 week. No known injury. Worse tonight after getting into her car. She is ambulatory.

## 2020-04-30 NOTE — Discharge Instructions (Signed)
  Take it easy, but do not lay around too much as this may make any stiffness worse.  Antiinflammatory medications: Take 600 mg of ibuprofen every 6 hours or 440 mg (over the counter dose) to 500 mg (prescription dose) of naproxen every 12 hours for the next 3 days. After this time, these medications may be used as needed for pain. Take these medications with food to avoid upset stomach. Choose only one of these medications, do not take them together. Acetaminophen (generic for Tylenol): Should you continue to have additional pain while taking the ibuprofen or naproxen, you may add in acetaminophen as needed. Your daily total maximum amount of acetaminophen from all sources should be limited to 4000mg /day for persons without liver problems, or 2000mg /day for those with liver problems. Methocarbamol: Methocarbamol (generic for Robaxin) is a muscle relaxer and can help relieve stiff muscles or muscle spasms.  Do not drive or perform other dangerous activities while taking this medication as it can cause drowsiness as well as changes in reaction time and judgement. Lidocaine patches: These are available via either prescription or over-the-counter. The over-the-counter option may be more economical one and are likely just as effective. There are multiple over-the-counter brands, such as Salonpas. Prednisone: Take the prednisone, as prescribed, until finished. If you are a diabetic, please know prednisone can raise your blood sugar temporarily. Ice: May apply ice to the area over the next 24 hours for 15 minutes at a time to reduce pain, inflammation, and swelling, if present. Exercises: Be sure to perform the attached exercises starting with three times a week and working up to performing them daily. This is an essential part of preventing long term problems.  Follow up: Follow up with a primary care provider or orthopedist for any future management of these complaints. Be sure to follow up within 7-10  days. Return: Return to the ED should symptoms worsen.  For prescription assistance, may try using prescription discount sites or apps, such as goodrx.com

## 2020-04-30 NOTE — ED Provider Notes (Signed)
[facility] EMERGENCY DEPARTMENT Provider Note   CSN: [id number] Arrival date & time: 04/30/20  1806     History No chief complaint on file.   Lindsay Gonzalez is a 54 y.o. female.  HPI      Lindsay Gonzalez is a 54 y.o. female, with a history of back pain, presenting to the ED with lower back pain and right leg pain for the past two weeks.   Worse with sitting and getting in and out of her car. Accompanied by paresthesias.  She has a distant history of similar pain. No recent trauma or procedures.  Denies fever/chills, abdominal pain, numbness, weakness, changes in bowel/bladder function, saddle anesthesias, urinary symptoms, or any other complaints.    Past Medical History:  Diagnosis Date  . Palpitations 10/30/2019    Patient Active Problem List   Diagnosis Date Noted  . Palpitations 10/30/2019  . Near syncope 10/30/2019  . Dyslipidemia 10/30/2019    Past Surgical History:  Procedure Laterality Date  . ABDOMINAL HYSTERECTOMY    . ACHILLES TENDON REPAIR Left   . CESAREAN SECTION    . KNEE SURGERY     x 3. left knee.     OB History   No obstetric history on file.     Family History  Problem Relation Age of Onset  . Hypertension Mother   . Diverticulitis Mother   . Hypertension Father   . Diabetes Father   . COPD Father   . Leukemia Father     Social History   Tobacco Use  . Smoking status: Never Smoker  . Smokeless tobacco: Never Used  Substance Use Topics  . Alcohol use: Not Currently  . Drug use: Not Currently    Home Medications Prior to Admission medications   Medication Sig Start Date End Date Taking? Authorizing Provider  escitalopram (LEXAPRO) 10 MG tablet Take 1 tablet by mouth daily. 01/11/20  Yes [provider]  montelukast (SINGULAIR) 10 MG tablet TAKE 1 TABLET BY MOUTH EVERY DAY AT NIGHT 01/12/20  Yes [provider]  lidocaine (LIDODERM) 5 % Place 1 patch onto the skin daily. Remove &  Discard patch within 12 hours or as directed by MD 04/30/20   Harris Kistler, Hillard Danker, PA-C  methocarbamol (ROBAXIN) 750 MG tablet Take 1 tablet (750 mg total) by mouth 2 (two) times daily as needed for muscle spasms (or muscle tightness). 04/30/20   Shontavia Mickel C, PA-C  predniSONE (STERAPRED UNI-PAK 21 TAB) 10 MG (21) TBPK tablet Take 6 tabs (60mg ) day 1, 5 tabs (50mg ) day 2, 4 tabs (40mg ) day 3, 3 tabs (30mg ) day 4, 2 tabs (20mg ) day 5, and 1 tab (10mg ) day 6. 04/30/20   Keygan Dumond C, PA-C    Allergies    Penicillins and Codeine  Review of Systems   Review of Systems  Constitutional: Negative for chills and fever.  Respiratory: Negative for shortness of breath.   Cardiovascular: Negative for chest pain.  Gastrointestinal: Negative for abdominal pain, nausea and vomiting.  Musculoskeletal: Positive for back pain. Negative for neck pain.  Neurological: Negative for syncope, weakness and numbness.  All other systems reviewed and are negative.   Physical Exam Updated Vital Signs BP 134/76   Pulse 72   Temp 98.1 F (36.7 C) (Oral)   Resp 18   Ht 5\' 11"  (1.803 m)   Wt 125.2 kg   SpO2 98%   BMI 38.50 kg/m   Physical Exam Vitals and nursing note reviewed.  Constitutional:      General: She is not in acute distress.    Appearance: She is well-developed. She is obese. She is not diaphoretic.  HENT:     Head: Normocephalic and atraumatic.     Mouth/Throat:     Mouth: Mucous membranes are moist.     Pharynx: Oropharynx is clear.  Eyes:     Conjunctiva/sclera: Conjunctivae normal.  Cardiovascular:     Rate and Rhythm: Normal rate and regular rhythm.     Pulses: Normal pulses.          Radial pulses are 2+ on the right side and 2+ on the left side.       Posterior tibial pulses are 2+ on the right side and 2+ on the left side.     Comments: Tactile temperature in the extremities appropriate and equal bilaterally. Pulmonary:     Effort: Pulmonary effort is normal. No respiratory distress.    Abdominal:     Palpations: Abdomen is soft.     Tenderness: There is no abdominal tenderness. There is no guarding.  Musculoskeletal:     Cervical back: Neck supple.     Right lower leg: No edema.     Left lower leg: No edema.  Skin:    General: Skin is warm and dry.  Neurological:     Mental Status: She is alert.     Comments: Sensation grossly intact to light touch in the lower extremities bilaterally. No saddle anesthesias. Strength 5/5 in the bilateral lower extremities. Slow, antalgic gait. Coordination intact.  Psychiatric:        Mood and Affect: Mood and affect normal.        Speech: Speech normal.        Behavior: Behavior normal.     ED Results / Procedures / Treatments   Labs (all labs ordered are listed, but only abnormal results are displayed) Labs Reviewed  URINALYSIS, ROUTINE W REFLEX MICROSCOPIC - Abnormal; Notable for the following components:      Result Value   Hgb urine dipstick TRACE (*)    All other components within normal limits  URINALYSIS, MICROSCOPIC (REFLEX) - Abnormal; Notable for the following components:   Bacteria, UA RARE (*)    All other components within normal limits    EKG None  Radiology No results found.  Procedures Procedures (including critical care time)  Medications Ordered in ED Medications - No data to display  ED Course  I have reviewed the triage vital signs and the nursing notes.  Pertinent labs & imaging results that were available during my care of the patient were reviewed by me and considered in my medical decision making (see chart for details).    MDM Rules/Calculators/A&P                          Patient presents with right lower back pain radiating into the right buttock and right leg. No evidence of acute neurovascular compromise.  No evidence for emergent neurosurgical pathology, such as cauda equina. The patient was given instructions for home care as well as return precautions. Patient voices  understanding of these instructions, accepts the plan, and is comfortable with discharge.   Final Clinical Impression(s) / ED Diagnoses Final diagnoses:  Acute right-sided low back pain with right-sided sciatica    Rx / DC Orders ED Discharge Orders         Ordered    methocarbamol (ROBAXIN) 750 MG tablet  2 times daily PRN        04/30/20 2121    lidocaine (LIDODERM) 5 %  Every 24 hours        04/30/20 2121    predniSONE (STERAPRED UNI-PAK 21 TAB) 10 MG (21) TBPK tablet        04/30/20 2121           Anselm Pancoast, PA-C 04/30/20 2142    Tegeler, Canary Brim, MD 04/30/20 [phone]

## 2020-05-13 ENCOUNTER — Telehealth: Payer: Self-pay | Admitting: Family Medicine

## 2020-05-13 NOTE — Telephone Encounter (Signed)
Per patient not a good time for scheduling, working as hairdresser--will cb to speak w/ for appt .--glh

## 2020-05-20 ENCOUNTER — Other Ambulatory Visit: Payer: Self-pay | Admitting: Family

## 2020-05-20 DIAGNOSIS — Z1231 Encounter for screening mammogram for malignant neoplasm of breast: Secondary | ICD-10-CM

## 2020-05-21 ENCOUNTER — Ambulatory Visit
Admission: RE | Admit: 2020-05-21 | Discharge: 2020-05-21 | Disposition: A | Discharge status: Home / Self Care | Payer: Medicaid Other | Source: Ambulatory Visit | Attending: Family | Admitting: Family

## 2020-05-21 ENCOUNTER — Other Ambulatory Visit: Payer: Self-pay

## 2020-05-21 DIAGNOSIS — Z1231 Encounter for screening mammogram for malignant neoplasm of breast: Secondary | ICD-10-CM

## 2020-08-22 ENCOUNTER — Ambulatory Visit (INDEPENDENT_AMBULATORY_CARE_PROVIDER_SITE_OTHER): Payer: Medicaid Other | Admitting: Family Medicine

## 2020-08-22 ENCOUNTER — Other Ambulatory Visit: Payer: Self-pay

## 2020-08-22 ENCOUNTER — Ambulatory Visit: Payer: Self-pay

## 2020-08-22 ENCOUNTER — Encounter: Payer: Self-pay | Admitting: Family Medicine

## 2020-08-22 VITALS — BP 108/78 | Ht 71.0 in | Wt 276.0 lb

## 2020-08-22 DIAGNOSIS — M222X1 Patellofemoral disorders, right knee: Secondary | ICD-10-CM | POA: Insufficient documentation

## 2020-08-22 DIAGNOSIS — M1732 Unilateral post-traumatic osteoarthritis, left knee: Secondary | ICD-10-CM | POA: Diagnosis not present

## 2020-08-22 DIAGNOSIS — M25562 Pain in left knee: Secondary | ICD-10-CM

## 2020-08-22 MED ORDER — PREDNISONE 5 MG PO TABS: ORAL_TABLET | ORAL | 0 refills | Status: DC

## 2020-08-22 NOTE — Assessment & Plan Note (Signed)
Has had 3 different surgeries.  Joint pain ACL reconstruction.  Does have degenerative changes of the knee and the meniscus. -Counseled on home exercise therapy and supportive care. -Prednisone. -Custom medial knee unloader due to thigh to calf ratio. -Could consider physical therapy or injection.
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
## 2020-08-22 NOTE — Assessment & Plan Note (Signed)
Symptoms are more consistent with patellofemoral in nature -Counseled on home exercise therapy and supportive care. -Could consider physical therapy.

## 2020-08-22 NOTE — Patient Instructions (Signed)
Nice to meet you Please try the exercises  Please try ice  The [employer] rep should give you a call.   Please send me a message in MyChart with any questions or updates.  Please see me back in 4 weeks.   --Dr. Jordan Likes

## 2020-08-23 ENCOUNTER — Encounter: Payer: Self-pay | Admitting: Family Medicine

## 2020-08-26 ENCOUNTER — Other Ambulatory Visit: Payer: Self-pay | Admitting: Family Medicine

## 2020-08-26 MED ORDER — DICLOFENAC SODIUM 1 % EX GEL: 4.0000 g | Freq: Four times a day (QID) | CUTANEOUS | 11 refills | Status: AC

## 2020-09-04 ENCOUNTER — Encounter: Payer: Self-pay | Admitting: Family Medicine

## 2020-09-16 ENCOUNTER — Ambulatory Visit: Payer: Medicaid Other | Admitting: Family Medicine

## 2020-09-16 NOTE — Progress Notes (Deleted)
  Lindsay Gonzalez - 55 y.o. female MRN [id number]  Date of birth: 11/09/1965  SUBJECTIVE:  Including CC & ROS.  No chief complaint on file.   Lindsay Gonzalez is a 55 y.o. female that is  ***.  ***   Review of Systems See HPI   HISTORY: Past Medical, Surgical, Social, and Family History Reviewed & Updated per EMR.   Pertinent Historical Findings include:  Past Medical History:  Diagnosis Date  . Palpitations 10/30/2019    Past Surgical History:  Procedure Laterality Date  . ABDOMINAL HYSTERECTOMY    . ACHILLES TENDON REPAIR Left   . CESAREAN SECTION    . KNEE SURGERY     x 3. left knee.    Family History  Problem Relation Age of Onset  . Hypertension Mother   . Diverticulitis Mother   . Hypertension Father   . Diabetes Father   . COPD Father   . Leukemia Father     Social History   Socioeconomic History  . Marital status: Divorced    Spouse name: Not on file  . Number of children: Not on file  . Years of education: Not on file  . Highest education level: Not on file  Occupational History  . Not on file  Tobacco Use  . Smoking status: Never Smoker  . Smokeless tobacco: Never Used  Substance and Sexual Activity  . Alcohol use: Not Currently  . Drug use: Not Currently  . Sexual activity: Not on file  Other Topics Concern  . Not on file  Social History Narrative  . Not on file   Social Determinants of Health   Financial Resource Strain: Not on file  Food Insecurity: Not on file  Transportation Needs: Not on file  Physical Activity: Not on file  Stress: Not on file  Social Connections: Not on file  Intimate Partner Violence: Not on file     PHYSICAL EXAM:  VS: There were no vitals taken for this visit. Physical Exam Gen: NAD, alert, cooperative with exam, well-appearing MSK:  ***      ASSESSMENT & PLAN:   No problem-specific Assessment & Plan notes found for this encounter.

## 2020-12-30 ENCOUNTER — Other Ambulatory Visit: Payer: Self-pay | Admitting: Orthopedic Surgery

## 2021-01-27 NOTE — Patient Instructions (Signed)
DUE TO COVID-19 ONLY ONE VISITOR IS ALLOWED TO COME WITH YOU AND STAY IN THE WAITING ROOM ONLY DURING PRE OP AND PROCEDURE DAY OF SURGERY. THE 2 VISITORS  MAY VISIT WITH YOU AFTER SURGERY IN YOUR PRIVATE ROOM DURING VISITING HOURS ONLY!  YOU NEED TO HAVE A COVID 19 TEST ON_8/25______THIS TEST MUST BE DONE BEFORE SURGERY,                Marelyn Rouser     Your procedure is scheduled on: 02/10/21   Report to [facility] Main  Entrance   Report to admitting at  6:30 AM     Call this number if you have problems the morning of surgery [phone]     BRUSH YOUR TEETH MORNING OF SURGERY AND RINSE YOUR MOUTH OUT, NO CHEWING GUM CANDY OR MINTS.   No food after midnight.    You may have clear liquid until 6:00 AM.    At 5:30 AM drink pre surgery drink.   Nothing by mouth after 6:00 AM.    Take these medicines the morning of surgery with A SIP OF WATER: none                                You may not have any metal on your body including hair pins and              piercings  Do not wear jewelry, make-up, lotions, powders or perfumes, deodorant             Do not wear nail polish on your fingernails.  Do not shave  48 hours prior to surgery.                Do not bring valuables to the hospital. [place] IS NOT             RESPONSIBLE   FOR VALUABLES.  Contacts, dentures or bridgework may not be worn into surgery.                    Please read over the following fact sheets you were given: _____________________________________________________________________             [facility] - Preparing for Surgery Before surgery, you can play an important role.  Because skin is not sterile, your skin needs to be as free of germs as possible.  You can reduce the number of germs on your skin by washing with CHG (chlorahexidine gluconate) soap before surgery.  CHG is an antiseptic cleaner which kills germs and bonds with the skin to continue killing germs even after  washing. Please DO NOT use if you have an allergy to CHG or antibacterial soaps.  If your skin becomes reddened/irritated stop using the CHG and inform your nurse when you arrive at Short Stay. Do not shave (including legs and underarms) for at least 48 hours prior to the first CHG shower.   Please follow these instructions carefully:  1.  Shower with CHG Soap the night before surgery and the  morning of Surgery.  2.  If you choose to wash your hair, wash your hair first as usual with your  normal  shampoo.  3.  After you shampoo, rinse your hair and body thoroughly to remove the  shampoo.  4.  Use CHG as you would any other liquid soap.  You can apply chg directly  to the skin and wash                       Gently with a scrungie or clean washcloth.  5.  Apply the CHG Soap to your body ONLY FROM THE NECK DOWN.   Do not use on face/ open                           Wound or open sores. Avoid contact with eyes, ears mouth and genitals (private parts).                       Wash face,  Genitals (private parts) with your normal soap.             6.  Wash thoroughly, paying special attention to the area where your surgery  will be performed.  7.  Thoroughly rinse your body with warm water from the neck down.  8.  DO NOT shower/wash with your normal soap after using and rinsing off  the CHG Soap.             9.  Pat yourself dry with a clean towel.            10.  Wear clean pajamas.            11.  Place clean sheets on your bed the night of your first shower and do not  sleep with pets. Day of Surgery : Do not apply any lotions/deodorants the morning of surgery.  Please wear clean clothes to the hospital/surgery center.  FAILURE TO FOLLOW THESE INSTRUCTIONS MAY RESULT IN THE CANCELLATION OF YOUR SURGERY PATIENT SIGNATURE_________________________________  NURSE  SIGNATURE__________________________________  ________________________________________________________________________   Adam Phenix  An incentive spirometer is a tool that can help keep your lungs clear and active. This tool measures how well you are filling your lungs with each breath. Taking long deep breaths may help reverse or decrease the chance of developing breathing (pulmonary) problems (especially infection) following: A long period of time when you are unable to move or be active. BEFORE THE PROCEDURE  If the spirometer includes an indicator to show your best effort, your nurse or respiratory therapist will set it to a desired goal. If possible, sit up straight or lean slightly forward. Try not to slouch. Hold the incentive spirometer in an upright position. INSTRUCTIONS FOR USE  Sit on the edge of your bed if possible, or sit up as far as you can in bed or on a chair. Hold the incentive spirometer in an upright position. Breathe out normally. Place the mouthpiece in your mouth and seal your lips tightly around it. Breathe in slowly and as deeply as possible, raising the piston or the ball toward the top of the column. Hold your breath for 3-5 seconds or for as long as possible. Allow the piston or ball to fall to the bottom of the column. Remove the mouthpiece from your mouth and breathe out normally. Rest for a few seconds and repeat Steps 1 through 7 at least 10 times every 1-2 hours when you are awake. Take your time and take a few normal breaths between deep breaths. The spirometer may include an indicator to show your best effort. Use the indicator as a goal to work toward during each repetition. After  each set of 10 deep breaths, practice coughing to be sure your lungs are clear. If you have an incision (the cut made at the time of surgery), support your incision when coughing by placing a pillow or rolled up towels firmly against it. Once you are able to get out of  bed, walk around indoors and cough well. You may stop using the incentive spirometer when instructed by your caregiver.  RISKS AND COMPLICATIONS Take your time so you do not get dizzy or light-headed. If you are in pain, you may need to take or ask for pain medication before doing incentive spirometry. It is harder to take a deep breath if you are having pain. AFTER USE Rest and breathe slowly and easily. It can be helpful to keep track of a log of your progress. Your caregiver can provide you with a simple table to help with this. If you are using the spirometer at home, follow these instructions: [place] IF:  You are having difficultly using the spirometer. You have trouble using the spirometer as often as instructed. Your pain medication is not giving enough relief while using the spirometer. You develop fever of 100.5 F (38.1 C) or higher. SEEK IMMEDIATE MEDICAL CARE IF:  You cough up bloody sputum that had not been present before. You develop fever of 102 F (38.9 C) or greater. You develop worsening pain at or near the incision site. MAKE SURE YOU:  Understand these instructions. Will watch your condition. Will get help right away if you are not doing well or get worse. Document Released: 10/12/2006 Document Revised: 08/24/2011 Document Reviewed: 12/13/2006 [employer] Patient Information 2014 [place], [place].   ________________________________________________________________________

## 2021-01-28 ENCOUNTER — Other Ambulatory Visit: Payer: Self-pay

## 2021-01-28 ENCOUNTER — Encounter (HOSPITAL_COMMUNITY)
Admission: RE | Admit: 2021-01-28 | Discharge: 2021-01-28 | Disposition: A | Discharge status: Home / Self Care | Payer: Medicaid Other | Source: Ambulatory Visit | Attending: Orthopedic Surgery | Admitting: Orthopedic Surgery

## 2021-01-28 ENCOUNTER — Encounter (HOSPITAL_COMMUNITY): Payer: Self-pay

## 2021-01-28 DIAGNOSIS — Z01818 Encounter for other preprocedural examination: Secondary | ICD-10-CM | POA: Diagnosis present

## 2021-01-28 HISTORY — DX: Prediabetes: R73.03

## 2021-01-28 LAB — COMPREHENSIVE METABOLIC PANEL
ALT: 20 U/L (ref 0–44)
AST: 21 U/L (ref 15–41)
Albumin: 3.9 g/dL (ref 3.5–5.0)
Alkaline Phosphatase: 94 U/L (ref 38–126)
Anion gap: 8 (ref 5–15)
BUN: 15 mg/dL (ref 6–20)
CO2: 26 mmol/L (ref 22–32)
Calcium: 9 mg/dL (ref 8.9–10.3)
Chloride: 104 mmol/L (ref 98–111)
Creatinine, Ser: 1.07 mg/dL — ABNORMAL HIGH (ref 0.44–1.00)
GFR, Estimated: >60 mL/min (ref >60)
Glucose, Bld: 161 mg/dL — ABNORMAL HIGH (ref 70–99)
Potassium: 3.8 mmol/L (ref 3.5–5.1)
Sodium: 138 mmol/L (ref 135–145)
Total Bilirubin: 0.6 mg/dL (ref 0.3–1.2)
Total Protein: 7.2 g/dL (ref 6.5–8.1)

## 2021-01-28 LAB — CBC WITH DIFFERENTIAL/PLATELET
Abs Immature Granulocytes: 0.03 10*3/uL (ref 0.00–0.07)
Basophils Absolute: 0 10*3/uL (ref 0.0–0.1)
Basophils Relative: 1 %
Eosinophils Absolute: 0.1 10*3/uL (ref 0.0–0.5)
Eosinophils Relative: 1 %
HCT: 43.2 % (ref 36.0–46.0)
Hemoglobin: 14 g/dL (ref 12.0–15.0)
Immature Granulocytes: 1 %
Lymphocytes Relative: 35 %
Lymphs Abs: 2.2 10*3/uL (ref 0.7–4.0)
MCH: 27.1 pg (ref 26.0–34.0)
MCHC: 32.4 g/dL (ref 30.0–36.0)
MCV: 83.7 fL (ref 80.0–100.0)
Monocytes Absolute: 0.4 10*3/uL (ref 0.1–1.0)
Monocytes Relative: 6 %
Neutro Abs: 3.6 10*3/uL (ref 1.7–7.7)
Neutrophils Relative %: 56 %
Platelets: 274 10*3/uL (ref 150–400)
RBC: 5.16 MIL/uL — ABNORMAL HIGH (ref 3.87–5.11)
RDW: 15.5 % (ref 11.5–15.5)
WBC: 6.3 10*3/uL (ref 4.0–10.5)
nRBC: 0 % (ref 0.0–0.2)

## 2021-01-28 LAB — HEMOGLOBIN A1C
Hgb A1c MFr Bld: 6 % — ABNORMAL HIGH (ref 4.8–5.6)
Mean Plasma Glucose: 125.5 mg/dL

## 2021-01-28 LAB — SURGICAL PCR SCREEN
MRSA, PCR: NEGATIVE
Staphylococcus aureus: NEGATIVE

## 2021-01-28 NOTE — Progress Notes (Signed)
   Covid test 02/06/21  COVID Vaccine Completed:yes   PCP - Roselyn Reef FNP Cardiologist - Dr. Kandyce Rud LOV 10/30/19-epic  Chest x-ray - no EKG - 01/28/21-chart Stress Test - no ECHO - 11/06/19-epic Cardiac Cath - no Pacemaker/ICD device last checked:NA  Sleep Study - no CPAP -   Fasting Blood Sugar - NA Checks Blood Sugar _____ times a day  Blood Thinner Instructions:NA Aspirin Instructions: Last Dose:  Anesthesia review: yes  Patient denies shortness of breath, fever, cough and chest pain at PAT appointment No SOB with activities. Pt has talk with her PCP about a sleep study.  Patient verbalized understanding of instructions that were given to them at the PAT appointment. Patient was also instructed that they will need to review over the PAT instructions again at home before surgery. yes

## 2021-01-28 NOTE — Progress Notes (Addendum)
PCP is aware of Pt' s Stop Bang score is 5. The Pt knows that she needs to address this after this surgery.

## 2021-01-31 NOTE — Anesthesia Preprocedure Evaluation (Addendum)
Anesthesia Evaluation  Patient identified by MRN, date of birth, ID band Patient awake    Reviewed: Allergy & Precautions, NPO status , Patient's Chart, lab work & pertinent test results  Airway Mallampati: II  TM Distance: >3 FB Neck ROM: Full    Dental  (+) Teeth Intact   Pulmonary neg pulmonary ROS,    Pulmonary exam normal        Cardiovascular negative cardio ROS   Rhythm:Regular Rate:Normal     Neuro/Psych negative neurological ROS  negative psych ROS   GI/Hepatic negative GI ROS, Neg liver ROS,   Endo/Other  negative endocrine ROS  Renal/GU negative Renal ROS  negative genitourinary   Musculoskeletal  (+) Arthritis , Osteoarthritis,    Abdominal (+)  Abdomen: soft. Bowel sounds: normal.  Peds  Hematology negative hematology ROS (+)   Anesthesia Other Findings   Reproductive/Obstetrics                             Anesthesia Physical Anesthesia Plan  ASA: 2  Anesthesia Plan: MAC, Regional and Spinal   Post-op Pain Management:  Regional for Post-op pain   Induction: Intravenous  PONV Risk Score and Plan: 2 and Ondansetron, Dexamethasone, Midazolam and Treatment may vary due to age or medical condition  Airway Management Planned: Simple Face Mask, Natural Airway and Nasal Cannula  Additional Equipment: None  Intra-op Plan:   Post-operative Plan:   Informed Consent: I have reviewed the patients History and Physical, chart, labs and discussed the procedure including the risks, benefits and alternatives for the proposed anesthesia with the patient or authorized representative who has indicated his/her understanding and acceptance.     Dental advisory given  Plan Discussed with: CRNA  Anesthesia Plan Comments: (See APP note by Durel Salts, FNP Lab Results      Component                Value               Date                      WBC                      6.3                  01/28/2021                HGB                      14.0                01/28/2021                HCT                      43.2                01/28/2021                MCV                      83.7                01/28/2021                PLT  274                 01/28/2021           Lab Results      Component                Value               Date                      NA                       138                 01/28/2021                K                        3.8                 01/28/2021                CO2                      26                  01/28/2021                GLUCOSE                  161 (H)             01/28/2021                BUN                      15                  01/28/2021                CREATININE               1.07 (H)            01/28/2021                CALCIUM                  9.0                 01/28/2021                GFRNONAA                 >60                 01/28/2021                GFRAA                                        01/06/2007            >60        The eGFR has been calculated using the MDRD equation. This calculation has not been validated in all clinical  Cardiac event monitor 01/09/20:  Asymptomatic PVCs and APCs. Triggered events for sinus rhythm  Echo 11/06/19:  1. Left ventricular ejection fraction, by estimation, is 60 to 65%. The left ventricle has normal function. The left ventricle has no regional wall motion abnormalities. Left ventricular diastolic parameters were normal.  2. Right ventricular systolic function is normal. The right ventricular size is normal.  3. The mitral valve is normal in structure. No evidence of mitral valve regurgitation. No evidence of mitral stenosis.  4. The aortic valve is normal in structure. Aortic valve regurgitation is not visualized. No aortic stenosis is present.  5. The inferior vena cava is normal in size with greater than 50% respiratory variability,  suggesting right atrial pressure of 3 mmHg. )       Anesthesia Quick Evaluation

## 2021-01-31 NOTE — Progress Notes (Signed)
Anesthesia Chart Review:   Case: [id number] Date/Time: 02/10/21 0845   Procedure: TOTAL KNEE ARTHROPLASTY (Left: Knee)   Anesthesia type: Spinal   Pre-op diagnosis: Osteoarthritis of left knee M17.12   Location: [facility] ROOM 06 / [facility]   Surgeons: Dannielle Huh, MD       DISCUSSION: Pt is 55 years old with hx palpitations, pre-diabetes  VS: BP (!) 158/83   Pulse 99   Temp 36.9 C (Oral)   Resp 20   Ht 5\' 11"  (1.803 m)   Wt 126.1 kg   SpO2 97%   BMI 38.77 kg/m   PROVIDERS:3 - PCP is , FNP - Pt was evaluated by cardiologist Donato Schultz, MD on 10/30/19 for near syncope and palpitations. Echo and event monitor ordered, showed reassuring results   LABS: Labs reviewed: Acceptable for surgery. (all labs ordered are listed, but only abnormal results are displayed)  Labs Reviewed  CBC WITH DIFFERENTIAL/PLATELET - Abnormal; Notable for the following components:      Result Value   RBC 5.16 (*)    All other components within normal limits  COMPREHENSIVE METABOLIC PANEL - Abnormal; Notable for the following components:   Glucose, Bld 161 (*)    Creatinine, Ser 1.07 (*)    All other components within normal limits  HEMOGLOBIN A1C - Abnormal; Notable for the following components:   Hgb A1c MFr Bld 6.0 (*)    All other components within normal limits  SURGICAL PCR SCREEN    EKG 01/28/21: NSR   CV: Cardiac event monitor 01/09/20:  Asymptomatic PVCs and APCs. Triggered events for sinus rhythm  Echo 11/06/19:  1. Left ventricular ejection fraction, by estimation, is 60 to 65%. The left ventricle has normal function. The left ventricle has no regional wall motion abnormalities. Left ventricular diastolic parameters were normal.   2. Right ventricular systolic function is normal. The right ventricular size is normal.   3. The mitral valve is normal in structure. No evidence of mitral valve regurgitation. No evidence of mitral stenosis.   4. The aortic valve is normal  in structure. Aortic valve regurgitation is not visualized. No aortic stenosis is present.   5. The inferior vena cava is normal in size with greater than 50% respiratory variability, suggesting right atrial pressure of 3 mmHg.    Past Medical History:  Diagnosis Date   Palpitations 10/30/2019   Pre-diabetes     Past Surgical History:  Procedure Laterality Date   ABDOMINAL HYSTERECTOMY     ACHILLES TENDON REPAIR Left    CESAREAN SECTION     KNEE SURGERY     x 3. left knee.    MEDICATIONS:  diclofenac Sodium (VOLTAREN) 1 % GEL   escitalopram (LEXAPRO) 10 MG tablet   lidocaine (LIDODERM) 5 %   LINZESS 145 MCG CAPS capsule   methocarbamol (ROBAXIN) 750 MG tablet   predniSONE (DELTASONE) 5 MG tablet   No current facility-administered medications for this encounter.    If no changes, I anticipate pt can proceed with surgery as scheduled.   11/01/2019, PhD, FNP-BC [facility]/Anesthesiology Phone: [phone] 01/31/2021 10:45 AM

## 2021-02-07 ENCOUNTER — Other Ambulatory Visit: Payer: Self-pay | Admitting: Orthopedic Surgery

## 2021-02-08 LAB — SARS CORONAVIRUS 2 (TAT 6-24 HRS): SARS Coronavirus 2: NEGATIVE

## 2021-02-09 MED ORDER — BUPIVACAINE LIPOSOME 1.3 % IJ SUSP
20.0000 mL | Freq: Once | INTRAMUSCULAR | Status: DC
  Filled 2021-02-09: qty 20

## 2021-02-10 ENCOUNTER — Ambulatory Visit (HOSPITAL_COMMUNITY): Payer: Medicaid Other | Admitting: Anesthesiology

## 2021-02-10 ENCOUNTER — Observation Stay (HOSPITAL_COMMUNITY)
Admission: RE | Admit: 2021-02-10 | Discharge: 2021-02-11 | Disposition: A | Discharge status: Home / Self Care | Payer: Medicaid Other | Attending: Orthopedic Surgery | Admitting: Orthopedic Surgery

## 2021-02-10 ENCOUNTER — Encounter (HOSPITAL_COMMUNITY): Payer: Self-pay | Admitting: Orthopedic Surgery

## 2021-02-10 ENCOUNTER — Ambulatory Visit (HOSPITAL_COMMUNITY): Payer: Medicaid Other | Admitting: Emergency Medicine

## 2021-02-10 ENCOUNTER — Other Ambulatory Visit: Payer: Self-pay

## 2021-02-10 ENCOUNTER — Encounter (HOSPITAL_COMMUNITY)
Admission: RE | Disposition: A | Discharge status: Home / Self Care | Payer: Self-pay | Source: Home / Self Care | Attending: Orthopedic Surgery

## 2021-02-10 DIAGNOSIS — R7303 Prediabetes: Secondary | ICD-10-CM | POA: Diagnosis not present

## 2021-02-10 DIAGNOSIS — Z96659 Presence of unspecified artificial knee joint: Secondary | ICD-10-CM

## 2021-02-10 DIAGNOSIS — M1712 Unilateral primary osteoarthritis, left knee: Principal | ICD-10-CM | POA: Insufficient documentation

## 2021-02-10 HISTORY — PX: TOTAL KNEE ARTHROPLASTY: SHX125

## 2021-02-10 LAB — GLUCOSE, CAPILLARY: Glucose-Capillary: 95 mg/dL (ref 70–99)

## 2021-02-10 SURGERY — ARTHROPLASTY, KNEE, TOTAL
Anesthesia: Monitor Anesthesia Care | Site: Knee | Laterality: Left

## 2021-02-10 MED ORDER — HYDROMORPHONE HCL 1 MG/ML IJ SOLN
0.5000 mg | INTRAMUSCULAR | Status: DC | PRN
  Administered 2021-02-10 – 2021-02-11 (×3): 1 mg via INTRAVENOUS
  Filled 2021-02-10 (×3): qty 1

## 2021-02-10 MED ORDER — PROPOFOL 10 MG/ML IV BOLUS
INTRAVENOUS | Status: DC | PRN
  Administered 2021-02-10: 20 mg via INTRAVENOUS
  Administered 2021-02-10: 30 mg via INTRAVENOUS
  Administered 2021-02-10 (×4): 20 mg via INTRAVENOUS

## 2021-02-10 MED ORDER — DEXAMETHASONE SODIUM PHOSPHATE 10 MG/ML IJ SOLN: 8.0000 mg | Freq: Once | INTRAMUSCULAR | Status: DC

## 2021-02-10 MED ORDER — FENTANYL CITRATE PF 50 MCG/ML IJ SOSY
PREFILLED_SYRINGE | INTRAMUSCULAR | Status: AC
  Filled 2021-02-10: qty 1

## 2021-02-10 MED ORDER — MIDAZOLAM HCL 2 MG/2ML IJ SOLN
1.0000 mg | INTRAMUSCULAR | Status: DC
  Administered 2021-02-10: 2 mg via INTRAVENOUS
  Filled 2021-02-10: qty 2

## 2021-02-10 MED ORDER — PROPOFOL 500 MG/50ML IV EMUL
INTRAVENOUS | Status: DC | PRN
  Administered 2021-02-10: 50 ug/kg/min via INTRAVENOUS

## 2021-02-10 MED ORDER — PROPOFOL 10 MG/ML IV BOLUS
INTRAVENOUS | Status: AC
  Filled 2021-02-10: qty 20

## 2021-02-10 MED ORDER — SENNOSIDES-DOCUSATE SODIUM 8.6-50 MG PO TABS: 1.0000 | ORAL_TABLET | Freq: Every evening | ORAL | Status: DC | PRN

## 2021-02-10 MED ORDER — LACTATED RINGERS IV SOLN: INTRAVENOUS | Status: DC

## 2021-02-10 MED ORDER — OXYCODONE HCL 5 MG PO TABS
ORAL_TABLET | ORAL | Status: AC
  Filled 2021-02-10: qty 1

## 2021-02-10 MED ORDER — BISACODYL 5 MG PO TBEC: 5.0000 mg | DELAYED_RELEASE_TABLET | Freq: Every day | ORAL | Status: DC | PRN

## 2021-02-10 MED ORDER — ONDANSETRON HCL 4 MG PO TABS: 4.0000 mg | ORAL_TABLET | Freq: Four times a day (QID) | ORAL | Status: DC | PRN

## 2021-02-10 MED ORDER — GABAPENTIN 300 MG PO CAPS
300.0000 mg | ORAL_CAPSULE | Freq: Once | ORAL | Status: AC
  Administered 2021-02-10: 300 mg via ORAL
  Filled 2021-02-10: qty 1

## 2021-02-10 MED ORDER — ONDANSETRON HCL 4 MG/2ML IJ SOLN
INTRAMUSCULAR | Status: DC | PRN
  Administered 2021-02-10: 4 mg via INTRAVENOUS

## 2021-02-10 MED ORDER — METHOCARBAMOL 500 MG PO TABS: 500.0000 mg | ORAL_TABLET | Freq: Four times a day (QID) | ORAL | Status: DC | PRN

## 2021-02-10 MED ORDER — METHOCARBAMOL 500 MG IVPB - SIMPLE MED
500.0000 mg | Freq: Four times a day (QID) | INTRAVENOUS | Status: DC | PRN
  Administered 2021-02-10: 500 mg via INTRAVENOUS
  Filled 2021-02-10: qty 50

## 2021-02-10 MED ORDER — ZOLPIDEM TARTRATE 5 MG PO TABS
5.0000 mg | ORAL_TABLET | Freq: Every evening | ORAL | Status: DC | PRN
  Administered 2021-02-11: 5 mg via ORAL
  Filled 2021-02-10: qty 1

## 2021-02-10 MED ORDER — TRANEXAMIC ACID-NACL 1000-0.7 MG/100ML-% IV SOLN
1000.0000 mg | INTRAVENOUS | Status: AC
  Administered 2021-02-10: 1000 mg via INTRAVENOUS
  Filled 2021-02-10: qty 100

## 2021-02-10 MED ORDER — ONDANSETRON HCL 4 MG/2ML IJ SOLN
INTRAMUSCULAR | Status: AC
  Filled 2021-02-10: qty 2

## 2021-02-10 MED ORDER — BUPIVACAINE-EPINEPHRINE 0.25% -1:200000 IJ SOLN
INTRAMUSCULAR | Status: DC | PRN
  Administered 2021-02-10: 30 mL

## 2021-02-10 MED ORDER — ASPIRIN EC 325 MG PO TBEC
325.0000 mg | DELAYED_RELEASE_TABLET | Freq: Two times a day (BID) | ORAL | Status: DC
  Administered 2021-02-11: 325 mg via ORAL
  Filled 2021-02-10: qty 1

## 2021-02-10 MED ORDER — SODIUM CHLORIDE (PF) 0.9 % IJ SOLN
INTRAMUSCULAR | Status: AC
  Filled 2021-02-10: qty 20

## 2021-02-10 MED ORDER — FENTANYL CITRATE PF 50 MCG/ML IJ SOSY
PREFILLED_SYRINGE | INTRAMUSCULAR | Status: AC
  Filled 2021-02-10: qty 2

## 2021-02-10 MED ORDER — SODIUM CHLORIDE 0.9% FLUSH
INTRAVENOUS | Status: DC | PRN
  Administered 2021-02-10: 20 mL

## 2021-02-10 MED ORDER — ONDANSETRON HCL 4 MG/2ML IJ SOLN: 4.0000 mg | Freq: Four times a day (QID) | INTRAMUSCULAR | Status: DC | PRN

## 2021-02-10 MED ORDER — FLEET ENEMA 7-19 GM/118ML RE ENEM: 1.0000 | ENEMA | Freq: Once | RECTAL | Status: DC | PRN

## 2021-02-10 MED ORDER — CHLORHEXIDINE GLUCONATE 0.12 % MT SOLN
15.0000 mL | Freq: Once | OROMUCOSAL | Status: AC
  Administered 2021-02-10: 15 mL via OROMUCOSAL

## 2021-02-10 MED ORDER — PANTOPRAZOLE SODIUM 40 MG PO TBEC
40.0000 mg | DELAYED_RELEASE_TABLET | Freq: Every day | ORAL | Status: DC
  Administered 2021-02-10 – 2021-02-11 (×2): 40 mg via ORAL
  Filled 2021-02-10 (×2): qty 1

## 2021-02-10 MED ORDER — OXYCODONE HCL 5 MG PO TABS
5.0000 mg | ORAL_TABLET | ORAL | Status: DC | PRN
  Administered 2021-02-10 (×2): 10 mg via ORAL
  Administered 2021-02-10 (×2): 5 mg via ORAL
  Administered 2021-02-11: 10 mg via ORAL
  Administered 2021-02-11: 5 mg via ORAL
  Administered 2021-02-11: 10 mg via ORAL
  Filled 2021-02-10 (×5): qty 2

## 2021-02-10 MED ORDER — DEXAMETHASONE SODIUM PHOSPHATE 10 MG/ML IJ SOLN
INTRAMUSCULAR | Status: DC | PRN
  Administered 2021-02-10: 10 mg via INTRAVENOUS

## 2021-02-10 MED ORDER — LINACLOTIDE 145 MCG PO CAPS
145.0000 ug | ORAL_CAPSULE | Freq: Every day | ORAL | Status: DC
  Administered 2021-02-11: 145 ug via ORAL
  Filled 2021-02-10: qty 1

## 2021-02-10 MED ORDER — ROPIVACAINE HCL 5 MG/ML IJ SOLN
INTRAMUSCULAR | Status: DC | PRN
  Administered 2021-02-10: 20 mL via PERINEURAL

## 2021-02-10 MED ORDER — ORAL CARE MOUTH RINSE: 15.0000 mL | Freq: Once | OROMUCOSAL | Status: AC

## 2021-02-10 MED ORDER — PHENOL 1.4 % MT LIQD: 1.0000 | OROMUCOSAL | Status: DC | PRN

## 2021-02-10 MED ORDER — BUPIVACAINE-EPINEPHRINE (PF) 0.25% -1:200000 IJ SOLN
INTRAMUSCULAR | Status: AC
  Filled 2021-02-10: qty 30

## 2021-02-10 MED ORDER — DEXAMETHASONE SODIUM PHOSPHATE 10 MG/ML IJ SOLN
10.0000 mg | Freq: Once | INTRAMUSCULAR | Status: AC
  Administered 2021-02-11: 10 mg via INTRAVENOUS
  Filled 2021-02-10: qty 1

## 2021-02-10 MED ORDER — ACETAMINOPHEN 500 MG PO TABS
1000.0000 mg | ORAL_TABLET | Freq: Four times a day (QID) | ORAL | Status: AC
  Administered 2021-02-10 – 2021-02-11 (×4): 1000 mg via ORAL
  Filled 2021-02-10 (×4): qty 2

## 2021-02-10 MED ORDER — DEXAMETHASONE SODIUM PHOSPHATE 10 MG/ML IJ SOLN
INTRAMUSCULAR | Status: AC
  Filled 2021-02-10: qty 1

## 2021-02-10 MED ORDER — FENTANYL CITRATE PF 50 MCG/ML IJ SOSY
50.0000 ug | PREFILLED_SYRINGE | INTRAMUSCULAR | Status: DC
Start: 2021-02-10 — End: 2021-02-10
  Administered 2021-02-10: 100 ug via INTRAVENOUS
  Filled 2021-02-10: qty 2

## 2021-02-10 MED ORDER — 0.9 % SODIUM CHLORIDE (POUR BTL) OPTIME
TOPICAL | Status: DC | PRN
  Administered 2021-02-10: 1000 mL

## 2021-02-10 MED ORDER — FERROUS SULFATE 325 (65 FE) MG PO TABS
325.0000 mg | ORAL_TABLET | Freq: Three times a day (TID) | ORAL | Status: DC
  Administered 2021-02-10 – 2021-02-11 (×3): 325 mg via ORAL
  Filled 2021-02-10 (×3): qty 1

## 2021-02-10 MED ORDER — DOCUSATE SODIUM 100 MG PO CAPS
100.0000 mg | ORAL_CAPSULE | Freq: Two times a day (BID) | ORAL | Status: DC
  Administered 2021-02-10 – 2021-02-11 (×3): 100 mg via ORAL
  Filled 2021-02-10 (×3): qty 1

## 2021-02-10 MED ORDER — POVIDONE-IODINE 10 % EX SWAB
2.0000 "application " | Freq: Once | CUTANEOUS | Status: AC
  Administered 2021-02-10: 2 via TOPICAL

## 2021-02-10 MED ORDER — ALUM & MAG HYDROXIDE-SIMETH 200-200-20 MG/5ML PO SUSP: 30.0000 mL | ORAL | Status: DC | PRN

## 2021-02-10 MED ORDER — DIPHENHYDRAMINE HCL 12.5 MG/5ML PO ELIX
12.5000 mg | ORAL_SOLUTION | ORAL | Status: DC | PRN
  Administered 2021-02-11 (×3): 25 mg via ORAL
  Filled 2021-02-10 (×3): qty 10

## 2021-02-10 MED ORDER — MENTHOL 3 MG MT LOZG: 1.0000 | LOZENGE | OROMUCOSAL | Status: DC | PRN

## 2021-02-10 MED ORDER — SODIUM CHLORIDE 0.9 % IR SOLN
Status: DC | PRN
  Administered 2021-02-10: 1000 mL

## 2021-02-10 MED ORDER — VANCOMYCIN HCL 1500 MG/300ML IV SOLN
1500.0000 mg | Freq: Once | INTRAVENOUS | Status: AC
  Administered 2021-02-10: 1500 mg via INTRAVENOUS
  Filled 2021-02-10: qty 300

## 2021-02-10 MED ORDER — FENTANYL CITRATE PF 50 MCG/ML IJ SOSY
25.0000 ug | PREFILLED_SYRINGE | INTRAMUSCULAR | Status: DC | PRN
  Administered 2021-02-10 (×3): 50 ug via INTRAVENOUS

## 2021-02-10 MED ORDER — DEXAMETHASONE SODIUM PHOSPHATE 10 MG/ML IJ SOLN
INTRAMUSCULAR | Status: DC | PRN
  Administered 2021-02-10: 5 mg

## 2021-02-10 MED ORDER — BUPIVACAINE IN DEXTROSE 0.75-8.25 % IT SOLN
INTRATHECAL | Status: DC | PRN
  Administered 2021-02-10: 1.8 mL via INTRATHECAL

## 2021-02-10 MED ORDER — SODIUM CHLORIDE 0.9 % IV SOLN: INTRAVENOUS | Status: DC

## 2021-02-10 MED ORDER — METOCLOPRAMIDE HCL 5 MG PO TABS: 5.0000 mg | ORAL_TABLET | Freq: Three times a day (TID) | ORAL | Status: DC | PRN

## 2021-02-10 MED ORDER — METOCLOPRAMIDE HCL 5 MG/ML IJ SOLN: 5.0000 mg | Freq: Three times a day (TID) | INTRAMUSCULAR | Status: DC | PRN

## 2021-02-10 MED ORDER — BUPIVACAINE LIPOSOME 1.3 % IJ SUSP
INTRAMUSCULAR | Status: DC | PRN
  Administered 2021-02-10: 20 mL

## 2021-02-10 MED ORDER — ACETAMINOPHEN 500 MG PO TABS
1000.0000 mg | ORAL_TABLET | Freq: Once | ORAL | Status: AC
  Administered 2021-02-10: 1000 mg via ORAL
  Filled 2021-02-10: qty 2

## 2021-02-10 MED ORDER — PROMETHAZINE HCL 25 MG/ML IJ SOLN: 6.2500 mg | INTRAMUSCULAR | Status: DC | PRN

## 2021-02-10 MED ORDER — CEFAZOLIN SODIUM-DEXTROSE 2-4 GM/100ML-% IV SOLN: 2.0000 g | INTRAVENOUS | Status: DC

## 2021-02-10 MED ORDER — METHOCARBAMOL 500 MG IVPB - SIMPLE MED
INTRAVENOUS | Status: AC
  Filled 2021-02-10: qty 50

## 2021-02-10 MED ORDER — PROPOFOL 500 MG/50ML IV EMUL
INTRAVENOUS | Status: AC
  Filled 2021-02-10: qty 50

## 2021-02-10 MED ORDER — WATER FOR IRRIGATION, STERILE IR SOLN
Status: DC | PRN
  Administered 2021-02-10: 2000 mL

## 2021-02-10 SURGICAL SUPPLY — 55 items
BAG COUNTER SPONGE SURGICOUNT (BAG) ×2 IMPLANT
BAG ZIPLOCK 12X15 (MISCELLANEOUS) IMPLANT
BLADE SAGITTAL 13X1.27X60 (BLADE) ×2 IMPLANT
BLADE SAW SGTL 18X1.27X75 (BLADE) ×2 IMPLANT
BLADE SURG 15 STRL LF DISP TIS (BLADE) ×1 IMPLANT
BLADE SURG 15 STRL SS (BLADE) ×2
BLADE SURG SZ10 CARB STEEL (BLADE) ×4 IMPLANT
BNDG ELASTIC 6X5.8 VLCR STR LF (GAUZE/BANDAGES/DRESSINGS) ×2 IMPLANT
BOWL SMART MIX CTS (DISPOSABLE) ×2 IMPLANT
CEMENT BONE SIMPLEX SPEEDSET (Cement) ×4 IMPLANT
COVER SURGICAL LIGHT HANDLE (MISCELLANEOUS) ×2 IMPLANT
CUFF TOURN SGL QUICK 34 (TOURNIQUET CUFF) ×1
CUFF TRNQT CYL 34X4.125X (TOURNIQUET CUFF) ×1 IMPLANT
DECANTER SPIKE VIAL GLASS SM (MISCELLANEOUS) ×6 IMPLANT
DRAPE INCISE IOBAN 66X45 STRL (DRAPES) ×4 IMPLANT
DRAPE U-SHAPE 47X51 STRL (DRAPES) ×2 IMPLANT
DRSG AQUACEL AG ADV 3.5X10 (GAUZE/BANDAGES/DRESSINGS) ×2 IMPLANT
DURAPREP 26ML APPLICATOR (WOUND CARE) ×4 IMPLANT
ELECT REM PT RETURN 15FT ADLT (MISCELLANEOUS) ×2 IMPLANT
FEMUR  CMT CCR STD SZ8 L KNEE (Knees) ×2 IMPLANT
FEMUR CMT CCR STD SZ8 L KNEE (Knees) ×1 IMPLANT
FEMUR CMTD CCR STD SZ8 L KNEE (Knees) ×1 IMPLANT
GLOVE SURG ENC TEXT LTX SZ7.5 (GLOVE) ×2 IMPLANT
GLOVE SURG ORTHO LTX SZ8 (GLOVE) ×6 IMPLANT
GLOVE SURG UNDER POLY LF SZ7.5 (GLOVE) ×2 IMPLANT
GLOVE SURG UNDER POLY LF SZ8.5 (GLOVE) ×4 IMPLANT
GOWN STRL REUS W/ TWL XL LVL3 (GOWN DISPOSABLE) ×2 IMPLANT
GOWN STRL REUS W/TWL XL LVL3 (GOWN DISPOSABLE) ×2
HANDPIECE INTERPULSE COAX TIP (DISPOSABLE) ×1
HOLDER FOLEY CATH W/STRAP (MISCELLANEOUS) ×2 IMPLANT
HOOD PEEL AWAY FLYTE STAYCOOL (MISCELLANEOUS) ×6 IMPLANT
HOVERMATT SINGLE USE (MISCELLANEOUS) ×2 IMPLANT
INSERT TIBIAL PLY L EF6-9X10 (Knees) ×2 IMPLANT
KIT TURNOVER KIT A (KITS) ×2 IMPLANT
MANIFOLD NEPTUNE II (INSTRUMENTS) ×2 IMPLANT
NEEDLE HYPO 22GX1.5 SAFETY (NEEDLE) ×2 IMPLANT
NS IRRIG 1000ML POUR BTL (IV SOLUTION) ×2 IMPLANT
PACK TOTAL KNEE CUSTOM (KITS) ×2 IMPLANT
PENCIL SMOKE EVACUATOR (MISCELLANEOUS) IMPLANT
PROTECTOR NERVE ULNAR (MISCELLANEOUS) ×2 IMPLANT
SET HNDPC FAN SPRY TIP SCT (DISPOSABLE) ×1 IMPLANT
STEM POLY PAT PLY 32M KNEE (Knees) ×2 IMPLANT
STEM TIBIA 5 DEG SZ E L KNEE (Knees) ×1 IMPLANT
STRIP CLOSURE SKIN 1/2X4 (GAUZE/BANDAGES/DRESSINGS) ×4 IMPLANT
SUT BONE WAX W31G (SUTURE) ×2 IMPLANT
SUT MNCRL AB 3-0 PS2 18 (SUTURE) ×2 IMPLANT
SUT STRATAFIX 0 PDS 27 VIOLET (SUTURE) ×2
SUT STRATAFIX PDS+ 0 24IN (SUTURE) ×2 IMPLANT
SUT VIC AB 1 CT1 36 (SUTURE) ×2 IMPLANT
SUTURE STRATFX 0 PDS 27 VIOLET (SUTURE) ×1 IMPLANT
SYR 30ML LL (SYRINGE) ×4 IMPLANT
TIBIA STEM 5 DEG SZ E L KNEE (Knees) ×2 IMPLANT
TRAY FOLEY MTR SLVR 14FR STAT (SET/KITS/TRAYS/PACK) ×2 IMPLANT
WATER STERILE IRR 1000ML POUR (IV SOLUTION) ×4 IMPLANT
WRAP KNEE MAXI GEL POST OP (GAUZE/BANDAGES/DRESSINGS) ×2 IMPLANT

## 2021-02-10 NOTE — Anesthesia Procedure Notes (Signed)
Anesthesia Regional Block: Adductor canal block   Pre-Anesthetic Checklist: , timeout performed,  Correct Patient, Correct Site, Correct Laterality,  Correct Procedure, Correct Position, site marked,  Risks and benefits discussed,  Surgical consent,  Pre-op evaluation,  At surgeon's request and post-op pain management  Laterality: Left  Prep: Dura Prep       Needles:  Injection technique: Single-shot  Needle Type: Echogenic Stimulator Needle     Needle Length: 10cm  Needle Gauge: 20     Additional Needles:   Procedures:,,,, ultrasound used (permanent image in chart),,    Narrative:  Start time: 02/10/2021 8:52 AM End time: 02/10/2021 8:54 AM Injection made incrementally with aspirations every 5 mL.  Performed by: Personally  Anesthesiologist: Atilano Median, DO  Additional Notes: Patient identified. Risks/Benefits/Options discussed with patient including but not limited to bleeding, infection, nerve damage, failed block, incomplete pain control. Patient expressed understanding and wished to proceed. All questions were answered. Sterile technique was used throughout the entire procedure. Please see nursing notes for vital signs. Aspirated in 5cc intervals with injection for negative confirmation. Patient was given instructions on fall risk and not to get out of bed. All questions and concerns addressed with instructions to call with any issues or inadequate analgesia.

## 2021-02-10 NOTE — Transfer of Care (Signed)
Immediate Anesthesia Transfer of Care Note  Patient: Lindsay Gonzalez  Procedure(s) Performed: TOTAL KNEE ARTHROPLASTY (Left: Knee)  Patient Location: PACU  Anesthesia Type:MAC and Spinal  Level of Consciousness: awake, alert , oriented and patient cooperative  Airway & Oxygen Therapy: Patient Spontanous Breathing and Patient connected to face mask oxygen  Post-op Assessment: Report given to RN and Post -op Vital signs reviewed and stable  Post vital signs: Reviewed and stable  Last Vitals:  Vitals Value Taken Time  BP 119/70 02/10/21 1123  Temp    Pulse 83 02/10/21 1125  Resp 12 02/10/21 1125  SpO2 100 % 02/10/21 1125  Vitals shown include unvalidated device data.  Last Pain:  Vitals:   02/10/21 0650  TempSrc: Oral  PainSc:       Patients Stated Pain Goal: 4 (17/71/16 5790)  Complications: No notable events documented.

## 2021-02-10 NOTE — Progress Notes (Signed)
Orthopedic Tech Progress Note Patient Details:  Lindsay Gonzalez [birth year] [id number]  CPM Left Knee CPM Left Knee: On Left Knee Flexion (Degrees): 90 Left Knee Extension (Degrees): 0 Additional Comments: foot roll  Post Interventions Patient Tolerated: Well Instructions Provided: Care of device, Adjustment of device  ANISSIA WESSELLS 02/10/2021, 11:34 AM

## 2021-02-10 NOTE — Anesthesia Procedure Notes (Signed)
Spinal  Patient location during procedure: OR Start time: 02/10/2021 9:24 AM End time: 02/10/2021 9:26 AM Staffing Performed: anesthesiologist  Anesthesiologist: Atilano Median, DO Preanesthetic Checklist Completed: patient identified, IV checked, site marked, risks and benefits discussed, surgical consent, monitors and equipment checked, pre-op evaluation and timeout performed Spinal Block Patient position: sitting Prep: DuraPrep Patient monitoring: heart rate, cardiac monitor, continuous pulse ox and blood pressure Approach: midline Location: L4-5 Injection technique: single-shot Needle Needle type: Pencan  Needle gauge: 24 G Needle length: 10 cm Assessment Events: CSF return and second provider Additional Notes Patient identified. Risks/Benefits/Options discussed with patient including but not limited to bleeding, infection, nerve damage, paralysis, failed block, incomplete pain control, headache, blood pressure changes, nausea, vomiting, reactions to medications, itching and postpartum back pain. Confirmed with bedside nurse the patient's most recent platelet count. Confirmed with patient that they are not currently taking any anticoagulation, have any bleeding history or any family history of bleeding disorders. Patient expressed understanding and wished to proceed. All questions were answered. Sterile technique was used throughout the entire procedure. Please see nursing notes for vital signs. Warning signs of high block given to the patient including shortness of breath, tingling/numbness in hands, complete motor block, or any concerning symptoms with instructions to call for help. Patient was given instructions on fall risk and not to get out of bed. All questions and concerns addressed with instructions to call with any issues or inadequate analgesia.

## 2021-02-10 NOTE — Progress Notes (Signed)
Assisted Dr. Greg Stoltzfus with left, ultrasound guided, adductor canal block. Side rails up, monitors on throughout procedure. See vital signs in flow sheet. Tolerated Procedure well.  

## 2021-02-10 NOTE — Evaluation (Signed)
Physical Therapy Evaluation Patient Details Name: Lindsay Gonzalez MRN: [id number] DOB: 05/01/1966 Today's Date: 02/10/2021   History of Present Illness  Patient is 55 y.o. female s/p Lt TKA on 02/10/21 with PMH significant for pre-diabetes, Lt achilles tendon repair.  Clinical Impression  Lindsay Gonzalez is a 55 y.o. female POD 0 s/p Lt TKA. Patient reports independence with mobility at baseline. Patient is now limited by functional impairments (see PT problem list below) and requires min-mod assist for transfers and gait with RW. Patient was able to take small steps with RW and mod assist to move bed>chair. Patient instructed in exercise to facilitate circulation to manage edema and reduce risk of DVT. Patient will benefit from continued skilled PT interventions to address impairments and progress towards PLOF. Acute PT will follow to progress mobility and stair training in preparation for safe discharge home.     Follow Up Recommendations Follow surgeon's recommendation for DC plan and follow-up therapies    Equipment Recommendations  None recommended by PT    Recommendations for Other Services       Precautions / Restrictions Precautions Precautions: Fall Restrictions Weight Bearing Restrictions: No Other Position/Activity Restrictions: WBAT      Mobility  Bed Mobility Overal bed mobility: Needs Assistance Bed Mobility: Supine to Sit     Supine to sit: Min assist;HOB elevated     General bed mobility comments: cues for sequencing and to bring bil LE's off EOB. pt used bed rail to bring trunk up and min assist to fully rise.    Transfers Overall transfer level: Needs assistance Equipment used: Rolling walker (2 wheeled) Transfers: Sit to/from [employer] Sit to Stand: Min assist;From elevated surface Stand pivot transfers: Min assist;Mod assist;From elevated surface       General transfer comment: cues for hand placement for power up from  elevated EOB to rise. pt required min assist to power up and mod assist to manage walker and sequence steps to recliner. Manual blocking for Lt knee to prevent buckling initially and pt improved use of UE's to prevent buckling.  Ambulation/Gait                Stairs            Wheelchair Mobility    Modified Rankin (Stroke Patients Only)       Balance Overall balance assessment: Needs assistance Sitting-balance support: Feet supported Sitting balance-Leahy Scale: Good     Standing balance support: During functional activity;Bilateral upper extremity supported Standing balance-Leahy Scale: Poor                               Pertinent Vitals/Pain Pain Assessment: 0-10 Pain Score: 7  Pain Location: Lt knee Pain Descriptors / Indicators: Aching;Discomfort Pain Intervention(s): Limited activity within patient's tolerance;Monitored during session;Premedicated before session;Repositioned;Ice applied    Home Living Family/patient expects to be discharged to:: Private residence Living Arrangements: Children Available Help at Discharge: Family Type of Home: Apartment Home Access: Stairs to enter Entrance Stairs-Rails: Can reach both Entrance Stairs-Number of Steps: 15 Home Layout: One level Home Equipment: Environmental consultant - 2 wheels;Bedside commode Additional Comments: 54 y.o. daughter lives with her.    Prior Function Level of Independence: Independent               Hand Dominance   Dominant Hand: Right    Extremity/Trunk Assessment   Upper Extremity Assessment Upper Extremity Assessment: Overall WFL for tasks  assessed    Lower Extremity Assessment Lower Extremity Assessment: Overall WFL for tasks assessed    Cervical / Trunk Assessment Cervical / Trunk Assessment: Normal  Communication   Communication: No difficulties  Cognition Arousal/Alertness: Awake/alert Behavior During Therapy: WFL for tasks assessed/performed Overall Cognitive  Status: Within Functional Limits for tasks assessed                                        General Comments      Exercises Total Joint Exercises Ankle Circles/Pumps: AROM;Both;20 reps;Seated   Assessment/Plan    PT Assessment Patient needs continued PT services  PT Problem List Decreased strength;Decreased range of motion;Decreased activity tolerance;Decreased balance;Decreased mobility;Decreased knowledge of use of DME;Pain       PT Treatment Interventions DME instruction;Gait training;Stair training;Functional mobility training;Therapeutic activities;Therapeutic exercise;Balance training;Patient/family education    PT Goals (Current goals can be found in the Care Plan section)  Acute Rehab PT Goals 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, television/film/video PT Goal Formulation: With patient Time For Goal Achievement: 02/17/21 Potential to Achieve Goals: Good    Frequency 7X/week   Barriers to discharge        Co-evaluation               AM-PAC PT "6 Clicks" Mobility  Outcome Measure Help needed turning from your back to your side while in a flat bed without using bedrails?: A Little Help needed moving from lying on your back to sitting on the side of a flat bed without using bedrails?: A Little Help needed moving to and from a bed to a chair (including a wheelchair)?: A Lot Help needed standing up from a chair using your arms (e.g., wheelchair or bedside chair)?: A Little Help needed to walk in hospital room?: A Lot Help needed climbing 3-5 steps with a railing? : A Lot 6 Click Score: 15    End of Session Equipment Utilized During Treatment: Gait belt Activity Tolerance: Patient tolerated treatment well Patient left: in chair;with chair alarm set;with call bell/phone within reach Nurse Communication: Mobility status PT Visit Diagnosis: Muscle weakness (generalized) (M62.81);Difficulty in walking, not elsewhere classified (R26.2)    Time:  6948-5462 PT Time Calculation (min) (ACUTE ONLY): 21 min   Charges:   PT Evaluation $PT Eval Low Complexity: 1 Low          Wynn Maudlin, DPT Acute Rehabilitation Services Office [phone] Pager [phone]   Anitra Lauth 02/10/2021, 5:16 PM

## 2021-02-10 NOTE — H&P (Signed)
Lindsay Gonzalez MRN:  [id number] DOB/SEX:  1966-04-16/female  CHIEF COMPLAINT:  Painful left Knee  HISTORY: Patient is a 55 y.o. female presented with a history of pain in the left knee. Onset of symptoms was gradual starting a few years ago with gradually worsening course since that time. Patient has been treated conservatively with over-the-counter NSAIDs and activity modification. Patient currently rates pain in the knee at 10 out of 10 with activity. There is pain at night.  PAST MEDICAL HISTORY: Patient Active Problem List   Diagnosis Date Noted   Patellofemoral pain syndrome of right knee 08/22/2020   Post-traumatic osteoarthritis of left knee 08/22/2020   Palpitations 10/30/2019   Near syncope 10/30/2019   Dyslipidemia 10/30/2019   Past Medical History:  Diagnosis Date   Palpitations 10/30/2019   Pre-diabetes    Past Surgical History:  Procedure Laterality Date   ABDOMINAL HYSTERECTOMY     ACHILLES TENDON REPAIR Left    CESAREAN SECTION     KNEE SURGERY     x 3. left knee.     MEDICATIONS:   Medications Prior to Admission  Medication Sig Dispense Refill Last Dose   LINZESS 145 MCG CAPS capsule Take 145 mcg by mouth daily as needed for constipation.   Past Week   diclofenac Sodium (VOLTAREN) 1 % GEL Apply 4 g topically 4 (four) times daily. To affected joint. (Patient not taking: Reported on 01/24/2021) 100 g 11 Not Taking   escitalopram (LEXAPRO) 10 MG tablet Take 1 tablet by mouth daily. (Patient not taking: Reported on 01/24/2021)   Not Taking   lidocaine (LIDODERM) 5 % Place 1 patch onto the skin daily. Remove & Discard patch within 12 hours or as directed by MD (Patient not taking: Reported on 01/24/2021) 30 patch 0 Not Taking   methocarbamol (ROBAXIN) 750 MG tablet Take 1 tablet (750 mg total) by mouth 2 (two) times daily as needed for muscle spasms (or muscle tightness). (Patient not taking: Reported on 01/24/2021) 20 tablet 0 Not Taking   predniSONE (DELTASONE) 5  MG tablet Take 6 pills for first day, 5 pills second day, 4 pills third day, 3 pills fourth day, 2 pills the fifth day, and 1 pill sixth day. (Patient not taking: No sig reported) 21 tablet 0 Not Taking    ALLERGIES:   Allergies  Allergen Reactions   Penicillins Swelling    Patient reported throat and skin swelling   Codeine Itching   Sulfamethoxazole Itching    Hands burning    REVIEW OF SYSTEMS:  A comprehensive review of systems was negative except for: Musculoskeletal: positive for arthralgias and bone pain   FAMILY HISTORY:   Family History  Problem Relation Age of Onset   Hypertension Mother    Diverticulitis Mother    Hypertension Father    Diabetes Father    COPD Father    Leukemia Father     SOCIAL HISTORY:   Social History   Tobacco Use   Smoking status: Never   Smokeless tobacco: Never  Substance Use Topics   Alcohol use: Not Currently     EXAMINATION:  Vital signs in last 24 hours: Temp:  [98.4 F (36.9 C)] 98.4 F (36.9 C) (08/29 0650) Pulse Rate:  [96] 96 (08/29 0650) Resp:  [20] 20 (08/29 0650) BP: (143)/(86) 143/86 (08/29 0650) SpO2:  [99 %] 99 % (08/29 0650)  BP (!) 143/86   Pulse 96   Temp 98.4 F (36.9 C) (Oral)   Resp 20  SpO2 99%   General Appearance:    Alert, cooperative, no distress, appears stated age  Head:    Normocephalic, without obvious abnormality, atraumatic  Eyes:    PERRL, conjunctiva/corneas clear, EOM's intact, fundi    benign, both eyes  Ears:    Normal TM's and external ear canals, both ears  Nose:   Nares normal, septum midline, mucosa normal, no drainage    or sinus tenderness  Throat:   Lips, mucosa, and tongue normal; teeth and gums normal  Neck:   Supple, symmetrical, trachea midline, no adenopathy;    thyroid:  no enlargement/tenderness/nodules; no carotid   bruit or JVD  Back:     Symmetric, no curvature, ROM normal, no CVA tenderness  Lungs:     Clear to auscultation bilaterally, respirations unlabored   Chest Wall:    No tenderness or deformity   Heart:    Regular rate and rhythm, S1 and S2 normal, no murmur, rub   or gallop  Breast Exam:    No tenderness, masses, or nipple abnormality  Abdomen:     Soft, non-tender, bowel sounds active all four quadrants,    no masses, no organomegaly  Genitalia:    Normal female without lesion, discharge or tenderness  Rectal:    Normal tone,  no masses or tenderness;   guaiac negative stool  Extremities:   Extremities normal, atraumatic, no cyanosis or edema  Pulses:   2+ and symmetric all extremities  Skin:   Skin color, texture, turgor normal, no rashes or lesions  Lymph nodes:   Cervical, supraclavicular, and axillary nodes normal  Neurologic:   CNII-XII intact, normal strength, sensation and reflexes    throughout    Musculoskeletal:  ROM 0-120, Ligaments intact,  Imaging Review Plain radiographs demonstrate severe degenerative joint disease of the left knee. The overall alignment is neutral. The bone quality appears to be good for age and reported activity level.  Assessment/Plan: Primary osteoarthritis, left knee   The patient history, physical examination and imaging studies are consistent with advanced degenerative joint disease of the left knee. The patient has failed conservative treatment.  The clearance notes were reviewed.  After discussion with the patient it was felt that Total Knee Replacement was indicated. The procedure,  risks, and benefits of total knee arthroplasty were presented and reviewed. The risks including but not limited to aseptic loosening, infection, blood clots, vascular injury, stiffness, patella tracking problems complications among others were discussed. The patient acknowledged the explanation, agreed to proceed with the plan.  Preoperative templating of the joint replacement has been completed, documented, and submitted to the Operating Room personnel in order to optimize intra-operative equipment management.     Patient's anticipated LOS is less than 2 midnights, meeting these requirements: - Younger than 47 - Lives within 1 hour of care - Has a competent adult at home to recover with post-op recover - NO history of  - Chronic pain requiring opiods  - Diabetes  - Coronary Artery Disease  - Heart failure  - Heart attack  - Stroke  - DVT/VTE  - Cardiac arrhythmia  - Respiratory Failure/COPD  - Renal failure  - Anemia  - Advanced Liver disease     Guy Sandifer 02/10/2021, 7:07 AM

## 2021-02-10 NOTE — Op Note (Signed)
TOTAL KNEE REPLACEMENT OPERATIVE NOTE:  02/10/2021  11:28 AM  PATIENT:  Lindsay Gonzalez  55 y.o. female  PRE-OPERATIVE DIAGNOSIS:  Osteoarthritis of left knee M17.12  POST-OPERATIVE DIAGNOSIS:  Osteoarthritis of left knee M17.12  PROCEDURE:  Procedure(s): TOTAL KNEE ARTHROPLASTY  SURGEON:  Surgeon(s): Dannielle Huh, MD  PHYSICIAN ASSISTANT: Laurier Nancy, PA-C   ANESTHESIA:   spinal  SPECIMEN: None  COUNTS:  Correct  TOURNIQUET:   Total Tourniquet Time Documented: Thigh (Left) - 63 minutes Total: Thigh (Left) - 63 minutes   DICTATION:  Indication for procedure:    The patient is a 55 y.o. female who has failed conservative treatment for Osteoarthritis of left knee M17.12.  Informed consent was obtained prior to anesthesia. The risks versus benefits of the operation were explain and in a way the patient can, and did, understand.     Description of procedure:     The patient was taken to the operating room and placed under anesthesia.  The patient was positioned in the usual fashion taking care that all body parts were adequately padded and/or protected.  A tourniquet was applied and the leg prepped and draped in the usual sterile fashion.  The extremity was exsanguinated with the esmarch and tourniquet inflated to 300 mmHg.  Pre-operative range of motion was normal.    A midline incision approximately 6-7 inches long was made with a #10 blade.  A new blade was used to make a parapatellar arthrotomy going 2-3 cm into the quadriceps tendon, over the patella, and alongside the medial aspect of the patellar tendon.  A synovectomy was then performed with the #10 blade and forceps. I then elevated the deep MCL off the medial tibial metaphysis subperiosteally around to the semimembranosus attachment.    I everted the patella and used calipers to measure patellar thickness.  I used the reamer to ream down to appropriate thickness to recreate the native thickness.  I then  removed excess bone with the rongeur and sagittal saw.  I used the appropriately sized template and drilled the three lug holes.  I then put the trial in place and measured the thickness with the calipers to ensure recreation of the native thickness.  The trial was then removed and the patella subluxed and the knee brought into flexion.  A homan retractor was place to retract and protect the patella and lateral structures.  A Z-retractor was place medially to protect the medial structures.  The extra-medullary alignment system was used to make cut the tibial articular surface perpendicular to the anamotic axis of the tibia and in 3 degrees of posterior slope.  The cut surface and alignment jig was removed.  I then used the intramedullary alignment guide to make a valgus cut on the distal femur.  I then marked out the epicondylar axis on the distal femur.  I then used the anterior referencing sizer and measured the femur to be a size 8.  The 4-In-1 cutting block was screwed into place in external rotation matching the posterior condylar angle, making our cuts perpendicular to the epicondylar axis.  Anterior, posterior and chamfer cuts were made with the sagittal saw.  The cutting block and cut pieces were removed.  A lamina spreader was placed in 90 degrees of flexion.  The ACL, PCL, menisci, and posterior condylar osteophytes were removed.  A 10 mm spacer blocked was found to offer good flexion and extension gap balance after minimal in degree releasing.   The scoop retractor was then  placed and the femoral finishing block was pinned in place.  The small sagittal saw was used as well as the lug drill to finish the femur.  The block and cut surfaces were removed and the medullary canal hole filled with autograft bone from the cut pieces.  The tibia was delivered forward in deep flexion and external rotation.  A size E tray was selected and pinned into place centered on the medial 1/3 of the tibial tubercle.   The reamer and keel was used to prepare the tibia through the tray.    I then trialed with the size 8 femur, size E tibia, a 10 mm insert and the 32 patella.  I had excellent flexion/extension gap balance, excellent patella tracking.  Flexion was full and beyond 120 degrees; extension was zero.  These components were chosen and the staff opened them to me on the back table while the knee was lavaged copiously and the cement mixed.  The soft tissue was infiltrated with 60cc of exparel 1.3% through a 21 gauge needle.  I cemented in the components and removed all excess cement.  The polyethylene tibial component was snapped into place and the knee placed in extension while cement was hardening.  The capsule was infilltrated with a 60cc exparel/marcaine/saline mixture.   Once the cement was hard, the tourniquet was let down.  Hemostasis was obtained.  The arthrotomy was closed using a #1 stratofix running suture.  The deep soft tissues were closed with #0 vicryls and the subcuticular layer closed with #2-0 vicryl.  The skin was reapproximated and closed with 3.0 Monocryl.  The wound was covered with steristrips, aquacel dressing, and a TED stocking.   The patient was then awakened, extubated, and taken to the recovery room in stable condition.  BLOOD LOSS:  300cc COMPLICATIONS:  None.  PLAN OF CARE: Admit for overnight observation  PATIENT DISPOSITION:  PACU - hemodynamically stable.    Please fax a copy of this op note to my office at [phone] (please only include page 1 and 2 of the Case Information op note)

## 2021-02-10 NOTE — Anesthesia Procedure Notes (Signed)
Date/Time: 02/10/2021 9:15 AM Performed by: Florene Route, CRNA Oxygen Delivery Method: Simple face mask

## 2021-02-11 DIAGNOSIS — M1712 Unilateral primary osteoarthritis, left knee: Secondary | ICD-10-CM | POA: Diagnosis not present

## 2021-02-11 LAB — BASIC METABOLIC PANEL
Anion gap: 6 (ref 5–15)
BUN: 14 mg/dL (ref 6–20)
CO2: 28 mmol/L (ref 22–32)
Calcium: 9.3 mg/dL (ref 8.9–10.3)
Chloride: 105 mmol/L (ref 98–111)
Creatinine, Ser: 0.9 mg/dL (ref 0.44–1.00)
GFR, Estimated: >60 mL/min (ref >60)
Glucose, Bld: 143 mg/dL — ABNORMAL HIGH (ref 70–99)
Potassium: 4.6 mmol/L (ref 3.5–5.1)
Sodium: 139 mmol/L (ref 135–145)

## 2021-02-11 LAB — CBC
HCT: 43.7 % (ref 36.0–46.0)
Hemoglobin: 13.6 g/dL (ref 12.0–15.0)
MCH: 26.8 pg (ref 26.0–34.0)
MCHC: 31.1 g/dL (ref 30.0–36.0)
MCV: 86.2 fL (ref 80.0–100.0)
Platelets: 261 10*3/uL (ref 150–400)
RBC: 5.07 MIL/uL (ref 3.87–5.11)
RDW: 15.3 % (ref 11.5–15.5)
WBC: 17.4 10*3/uL — ABNORMAL HIGH (ref 4.0–10.5)
nRBC: 0 % (ref 0.0–0.2)

## 2021-02-11 MED ORDER — LIP MEDEX EX OINT
TOPICAL_OINTMENT | CUTANEOUS | Status: AC
  Filled 2021-02-11: qty 7

## 2021-02-11 MED ORDER — OXYCODONE HCL 5 MG PO TABS: 5.0000 mg | ORAL_TABLET | Freq: Four times a day (QID) | ORAL | 0 refills | Status: DC | PRN

## 2021-02-11 MED ORDER — METHOCARBAMOL 500 MG PO TABS: 500.0000 mg | ORAL_TABLET | Freq: Four times a day (QID) | ORAL | 0 refills | Status: DC | PRN

## 2021-02-11 MED ORDER — GABAPENTIN 300 MG PO CAPS: 300.0000 mg | ORAL_CAPSULE | Freq: Three times a day (TID) | ORAL | 0 refills | Status: AC

## 2021-02-11 MED ORDER — ASPIRIN 325 MG PO TBEC: 325.0000 mg | DELAYED_RELEASE_TABLET | Freq: Two times a day (BID) | ORAL | 0 refills | Status: AC

## 2021-02-11 NOTE — Discharge Summary (Signed)
SPORTS MEDICINE & JOINT REPLACEMENT   Georgena Spurling, MD   Laurier Nancy, PA-C [address] [place], [place], [place]  [address]                             [phone]  PATIENT ID: Lindsay Gonzalez        MRN:  [id number]          DOB/AGE: Aug 19, 1965 / 55 y.o.    DISCHARGE SUMMARY  ADMISSION DATE:    02/10/2021 DISCHARGE DATE:   02/11/2021   ADMISSION DIAGNOSIS: S/P total knee replacement [Z96.659]    DISCHARGE DIAGNOSIS:  Osteoarthritis of left knee M17.12    ADDITIONAL DIAGNOSIS: Active Problems:   S/P total knee replacement  Past Medical History:  Diagnosis Date   Palpitations 10/30/2019   Pre-diabetes     PROCEDURE: Procedure(s): TOTAL KNEE ARTHROPLASTY on 02/10/2021  CONSULTS:    HISTORY:  See H&P in chart  HOSPITAL COURSE:  Lindsay Gonzalez is a 55 y.o. admitted on 02/10/2021 and found to have a diagnosis of Osteoarthritis of left knee M17.12.  After appropriate laboratory studies were obtained  they were taken to the operating room on 02/10/2021 and underwent Procedure(s): TOTAL KNEE ARTHROPLASTY.   They were given perioperative antibiotics:  Anti-infectives (From admission, onward)    Start     Dose/Rate Route Frequency Ordered Stop   02/10/21 0700  vancomycin (VANCOREADY) IVPB 1500 mg/300 mL        1,500 mg 150 mL/hr over 120 Minutes Intravenous  Once 02/10/21 0656 02/10/21 1018   02/10/21 0645  ceFAZolin (ANCEF) IVPB 2g/100 mL premix  Status:  Discontinued        2 g 200 mL/hr over 30 Minutes Intravenous On call to O.R. 02/10/21 2671 02/10/21 0654     .  Patient given tranexamic acid IV or topical and exparel intra-operatively.  Tolerated the procedure well.    POD# 1: Vital signs were stable.  Patient denied Chest pain, shortness of breath, or calf pain.  Patient was started on Aspirin twice daily at 8am.  Consults to PT, OT, and care management were made.  The patient was weight bearing as tolerated.  CPM was placed on the operative leg 0-90  degrees for 6-8 hours a day. When out of the CPM, patient was placed in the foam block to achieve full extension. Incentive spirometry was taught.  Dressing was changed.       POD #2, Continued  PT for ambulation and exercise program.  IV saline locked.  O2 discontinued.    The remainder of the hospital course was dedicated to ambulation and strengthening.   The patient was discharged on 1 Day Post-Op in  Good condition.  Blood products given:none  DIAGNOSTIC STUDIES: Recent vital signs: Patient Vitals for the past 24 hrs:  BP Temp Temp src Pulse Resp SpO2  02/11/21 1058 113/81 98.1 F (36.7 C) Oral 81 15 99 %  02/11/21 0557 112/66 (!) 97.5 F (36.4 C) Oral 94 18 100 %  02/11/21 0103 117/73 97.7 F (36.5 C) Oral 84 18 96 %  02/10/21 2129 113/72 98 F (36.7 C) Oral 86 18 97 %  02/10/21 1830 124/72 97.7 F (36.5 C) Oral 89 18 95 %  02/10/21 1626 123/68 98.4 F (36.9 C) -- 77 18 98 %  02/10/21 1334 138/80 97.9 F (36.6 C) Oral 85 16 --  02/10/21 1300 110/66 -- -- 87 -- 100 %  Recent laboratory studies: Recent Labs    02/11/21 0412  WBC 17.4*  HGB 13.6  HCT 43.7  PLT 261   Recent Labs    02/11/21 0412  NA 139  K 4.6  CL 105  CO2 28  BUN 14  CREATININE 0.90  GLUCOSE 143*  CALCIUM 9.3   Lab Results  Component Value Date   INR 1.0 01/05/2007     Recent Radiographic Studies :  No results found.  DISCHARGE INSTRUCTIONS: Discharge Instructions     Call MD / Call 911   Complete by: As directed    If you experience chest pain or shortness of breath, CALL 911 and be transported to the hospital emergency room.  If you develope a fever above 101 F, pus (white drainage) or increased drainage or redness at the wound, or calf pain, call your surgeon's office.   Constipation Prevention   Complete by: As directed    Drink plenty of fluids.  Prune juice may be helpful.  You may use a stool softener, such as Colace (over the counter) 100 mg twice a day.  Use MiraLax  (over the counter) for constipation as needed.   Diet - low sodium heart healthy   Complete by: As directed    Discharge instructions   Complete by: As directed    INSTRUCTIONS AFTER JOINT REPLACEMENT   Remove items at home which could result in a fall. This includes throw rugs or furniture in walking pathways ICE to the affected joint every three hours while awake for 30 minutes at a time, for at least the first 3-5 days, and then as needed for pain and swelling.  Continue to use ice for pain and swelling. You may notice swelling that will progress down to the foot and ankle.  This is normal after surgery.  Elevate your leg when you are not up walking on it.   Continue to use the breathing machine you got in the hospital (incentive spirometer) which will help keep your temperature down.  It is common for your temperature to cycle up and down following surgery, especially at night when you are not up moving around and exerting yourself.  The breathing machine keeps your lungs expanded and your temperature down.   DIET:  As you were doing prior to hospitalization, we recommend a well-balanced diet.  DRESSING / WOUND CARE / SHOWERING  Keep the surgical dressing until follow up.  The dressing is water proof, so you can shower without any extra covering.  IF THE DRESSING FALLS OFF or the wound gets wet inside, change the dressing with sterile gauze.  Please use good hand washing techniques before changing the dressing.  Do not use any lotions or creams on the incision until instructed by your surgeon.    ACTIVITY  Increase activity slowly as tolerated, but follow the weight bearing instructions below.   No driving for 6 weeks or until further direction given by your physician.  You cannot drive while taking narcotics.  No lifting or carrying greater than 10 lbs. until further directed by your surgeon. Avoid periods of inactivity such as sitting longer than an hour when not asleep. This helps  prevent blood clots.  You may return to work once you are authorized by your doctor.     WEIGHT BEARING   Weight bearing as tolerated with assist device (walker, cane, etc) as directed, use it as long as suggested by your surgeon or therapist, typically at least 4-6 weeks.  EXERCISES  Results after joint replacement surgery are often greatly improved when you follow the exercise, range of motion and muscle strengthening exercises prescribed by your doctor. Safety measures are also important to protect the joint from further injury. Any time any of these exercises cause you to have increased pain or swelling, decrease what you are doing until you are comfortable again and then slowly increase them. If you have problems or questions, call your caregiver or physical therapist for advice.   Rehabilitation is important following a joint replacement. After just a few days of immobilization, the muscles of the leg can become weakened and shrink (atrophy).  These exercises are designed to build up the tone and strength of the thigh and leg muscles and to improve motion. Often times heat used for twenty to thirty minutes before working out will loosen up your tissues and help with improving the range of motion but do not use heat for the first two weeks following surgery (sometimes heat can increase post-operative swelling).   These exercises can be done on a training (exercise) mat, on the floor, on a table or on a bed. Use whatever works the best and is most comfortable for you.    Use music or television while you are exercising so that the exercises are a pleasant break in your day. This will make your life better with the exercises acting as a break in your routine that you can look forward to.   Perform all exercises about fifteen times, three times per day or as directed.  You should exercise both the operative leg and the other leg as well.  Exercises include:   Quad Sets - Tighten up the muscle  on the front of the thigh (Quad) and hold for 5-10 seconds.   Straight Leg Raises - With your knee straight (if you were given a brace, keep it on), lift the leg to 60 degrees, hold for 3 seconds, and slowly lower the leg.  Perform this exercise against resistance later as your leg gets stronger.  Leg Slides: Lying on your back, slowly slide your foot toward your buttocks, bending your knee up off the floor (only go as far as is comfortable). Then slowly slide your foot back down until your leg is flat on the floor again.  Angel Wings: Lying on your back spread your legs to the side as far apart as you can without causing discomfort.  Hamstring Strength:  Lying on your back, push your heel against the floor with your leg straight by tightening up the muscles of your buttocks.  Repeat, but this time bend your knee to a comfortable angle, and push your heel against the floor.  You may put a pillow under the heel to make it more comfortable if necessary.   A rehabilitation program following joint replacement surgery can speed recovery and prevent re-injury in the future due to weakened muscles. Contact your doctor or a physical therapist for more information on knee rehabilitation.    CONSTIPATION  Constipation is defined medically as fewer than three stools per week and severe constipation as less than one stool per week.  Even if you have a regular bowel pattern at home, your normal regimen is likely to be disrupted due to multiple reasons following surgery.  Combination of anesthesia, postoperative narcotics, change in appetite and fluid intake all can affect your bowels.   YOU MUST use at least one of the following options; they are listed in order of increasing  strength to get the job done.  They are all available over the counter, and you may need to use some, POSSIBLY even all of these options:    Drink plenty of fluids (prune juice may be helpful) and high fiber foods Colace 100 mg by mouth  twice a day  Senokot for constipation as directed and as needed Dulcolax (bisacodyl), take with full glass of water  Miralax (polyethylene glycol) once or twice a day as needed.  If you have tried all these things and are unable to have a bowel movement in the first 3-4 days after surgery call either your surgeon or your primary doctor.    If you experience loose stools or diarrhea, hold the medications until you stool forms back up.  If your symptoms do not get better within 1 week or if they get worse, check with your doctor.  If you experience "the worst abdominal pain ever" or develop nausea or vomiting, please contact the office immediately for further recommendations for treatment.   ITCHING:  If you experience itching with your medications, try taking only a single pain pill, or even half a pain pill at a time.  You can also use Benadryl over the counter for itching or also to help with sleep.   TED HOSE STOCKINGS:  Use stockings on both legs until for at least 2 weeks or as directed by physician office. They may be removed at night for sleeping.  MEDICATIONS:  See your medication summary on the "After Visit Summary" that nursing will review with you.  You may have some home medications which will be placed on hold until you complete the course of blood thinner medication.  It is important for you to complete the blood thinner medication as prescribed.  PRECAUTIONS:  If you experience chest pain or shortness of breath - call 911 immediately for transfer to the hospital emergency department.   If you develop a fever greater that 101 F, purulent drainage from wound, increased redness or drainage from wound, foul odor from the wound/dressing, or calf pain - CONTACT YOUR SURGEON.                                                   FOLLOW-UP APPOINTMENTS:  If you do not already have a post-op appointment, please call the office for an appointment to be seen by your surgeon.  Guidelines for how  soon to be seen are listed in your "After Visit Summary", but are typically between 1-4 weeks after surgery.  OTHER INSTRUCTIONS:   Knee Replacement:  Do not place pillow under knee, focus on keeping the knee straight while resting. CPM instructions: 0-90 degrees, 2 hours in the morning, 2 hours in the afternoon, and 2 hours in the evening. Place foam block, curve side up under heel at all times except when in CPM or when walking.  DO NOT modify, tear, cut, or change the foam block in any way.  POST-OPERATIVE OPIOID TAPER INSTRUCTIONS: It is important to wean off of your opioid medication as soon as possible. If you do not need pain medication after your surgery it is ok to stop day one. Opioids include: Codeine, Hydrocodone(Norco, Vicodin), Oxycodone(Percocet, oxycontin) and hydromorphone amongst others.  Long term and even short term use of opiods can cause: Increased pain response Dependence Constipation Depression Respiratory depression And  more.  Withdrawal symptoms can include Flu like symptoms Nausea, vomiting And more Techniques to manage these symptoms Hydrate well Eat regular healthy meals Stay active Use relaxation techniques(deep breathing, meditating, yoga) Do Not substitute Alcohol to help with tapering If you have been on opioids for less than two weeks and do not have pain than it is ok to stop all together.  Plan to wean off of opioids This plan should start within one week post op of your joint replacement. Maintain the same interval or time between taking each dose and first decrease the dose.  Cut the total daily intake of opioids by one tablet each day Next start to increase the time between doses. The last dose that should be eliminated is the evening dose.     MAKE SURE YOU:  Understand these instructions.  Get help right away if you are not doing well or get worse.    Thank you for letting us be a part of your medical care team.  It is a privilege we  respect greatly.  We hope these instructions will help you stay on track for a fast and full recovery!   Increase activity slowly as tolerated   Complete by: As directed    Post-operative opioid taper instructions:   Complete by: As directed    POST-OPERATIVE OPIOID TAPER INSTRUCTIONS: It is important to wean off of your opioid medication as soon as possible. If you do not need pain medication after your surgery it is ok to stop day one. Opioids include: Codeine, Hydrocodone(Norco, Vicodin), Oxycodone(Percocet, oxycontin) and hydromorphone amongst others.  Long term and even short term use of opiods can cause: Increased pain response Dependence Constipation Depression Respiratory depression And more.  Withdrawal symptoms can include Flu like symptoms Nausea, vomiting And more Techniques to manage these symptoms Hydrate well Eat regular healthy meals Stay active Use relaxation techniques(deep breathing, meditating, yoga) Do Not substitute Alcohol to help with tapering If you have been on opioids for less than two weeks and do not have pain than it is ok to stop all together.  Plan to wean off of opioids This plan should start within one week post op of your joint replacement. Maintain the same interval or time between taking each dose and first decrease the dose.  Cut the total daily intake of opioids by one tablet each day Next start to increase the time between doses. The last dose that should be eliminated is the evening dose.          DISCHARGE MEDICATIONS:   Allergies as of 02/11/2021       Reactions   Penicillins Swelling   Patient reported throat and skin swelling   Codeine Itching   Sulfamethoxazole Itching   Hands burning        Medication List     STOP taking these medications    escitalopram 10 MG tablet Commonly known as: LEXAPRO   predniSONE 5 MG tablet Commonly known as: DELTASONE       TAKE these medications    aspirin 325 MG EC  tablet Take 1 tablet (325 mg total) by mouth 2 (two) times daily.   diclofenac Sodium 1 % Gel Commonly known as: VOLTAREN Apply 4 g topically 4 (four) times daily. To affected joint.   gabapentin 300 MG capsule Commonly known as: NEURONTIN Take 1 capsule (300 mg total) by mouth 3 (three) times daily.   lidocaine 5 % Commonly known as: Lidoderm Place 1 patch onto the skin  daily. Remove & Discard patch within 12 hours or as directed by MD   Linzess 145 MCG Caps capsule Generic drug: linaclotide Take 145 mcg by mouth daily as needed for constipation.   methocarbamol 750 MG tablet Commonly known as: ROBAXIN Take 1 tablet (750 mg total) by mouth 2 (two) times daily as needed for muscle spasms (or muscle tightness). What changed: Another medication with the same name was added. Make sure you understand how and when to take each.   methocarbamol 500 MG tablet Commonly known as: ROBAXIN Take 1-2 tablets (500-1,000 mg total) by mouth every 6 (six) hours as needed for muscle spasms. What changed: You were already taking a medication with the same name, and this prescription was added. Make sure you understand how and when to take each.   oxyCODONE 5 MG immediate release tablet Commonly known as: Oxy IR/ROXICODONE Take 1-2 tablets (5-10 mg total) by mouth every 6 (six) hours as needed for moderate pain (pain score 4-6).               Durable Medical Equipment  (From admission, onward)           Start     Ordered   02/10/21 1331  DME Walker rolling  Once       Question:  Patient needs a walker to treat with the following condition  Answer:  S/P total knee replacement   02/10/21 1330   02/10/21 1331  DME 3 n 1  Once        02/10/21 1330   02/10/21 1331  DME Bedside commode  Once       Question:  Patient needs a bedside commode to treat with the following condition  Answer:  S/P total knee replacement   02/10/21 1330            FOLLOW UP VISIT:    DISPOSITION: HOME  VS. SNF  Dental Antibiotics:  In most cases prophylactic antibiotics for Dental procdeures after total joint surgery are not necessary.  Exceptions are as follows:  1. History of prior total joint infection  2. Severely immunocompromised (Organ Transplant, cancer chemotherapy, Rheumatoid biologic meds such as Humera)  3. Poorly controlled diabetes (A1C &gt; 8.0, blood glucose over 200)  If you have one of these conditions, contact your surgeon for an antibiotic prescription, prior to your dental procedure.   CONDITION:  Good   Guy Sandifer 02/11/2021, 12:49 PM

## 2021-02-11 NOTE — TOC Transition Note (Signed)
Transition of Care [facility]) - CM/SW Discharge Note  Patient Details  Name: [name] MRN: [id number] Date of Birth: 25-Jun-1965  Transition of Care [facility]) CM/SW Contact:  Ewing Schlein, LCSW Phone Number: 02/11/2021, 11:30 AM  Clinical Narrative: Patient is expected to discharge home after working with PT. CSW spoke with patient to review discharge plan. Patient will go to OPPT at [facility]. CSW confirmed with [employer] that patient was set up with a rolling walker, 3N1, and CPM last week. TOC signing off.  Final next level of care: OP Rehab Barriers to Discharge: No Barriers Identified  Patient Goals and CMS Choice Patient states their goals for this hospitalization and ongoing recovery are:: Discharge home CMS Medicare.gov Compare Post Acute Care list provided to:: Patient Choice offered to / list presented to : NA  Discharge Plan and Services        DME Arranged: N/A DME Agency: NA  Readmission Risk Interventions No flowsheet data found.

## 2021-02-11 NOTE — Progress Notes (Signed)
[facility]  Georgena Spurling, MD    Laurier Nancy, PA-C [address] [place], [place], [place]  [address]                             [phone]   PROGRESS NOTE  Subjective:  negative for Chest Pain  negative for Shortness of Breath  negative for Nausea/Vomiting   negative for Calf Pain  negative for Bowel Movement   Tolerating Diet: yes         Patient reports pain as 3 on 0-10 scale.    Objective: Vital signs in last 24 hours:   Patient Vitals for the past 24 hrs:  BP Temp Temp src Pulse Resp SpO2  02/11/21 1058 113/81 98.1 F (36.7 C) Oral 81 15 99 %  02/11/21 0557 112/66 (!) 97.5 F (36.4 C) Oral 94 18 100 %  02/11/21 0103 117/73 97.7 F (36.5 C) Oral 84 18 96 %  02/10/21 2129 113/72 98 F (36.7 C) Oral 86 18 97 %  02/10/21 1830 124/72 97.7 F (36.5 C) Oral 89 18 95 %  02/10/21 1626 123/68 98.4 F (36.9 C) -- 77 18 98 %  02/10/21 1334 138/80 97.9 F (36.6 C) Oral 85 16 --  02/10/21 1300 110/66 -- -- 87 -- 100 %    @flow {1959:LAST@   Intake/Output from previous day:   08/29 0701 - 08/30 0700 In: 3701.4 [P.O.:960; I.V.:2291.4] Out: 5075 [Urine:5025]   Intake/Output this shift:   08/30 0701 - 08/30 1900 In: 120 [P.O.:120] Out: 200 [Urine:200]   Intake/Output      08/29 0701 08/30 0700 08/30 0701 08/31 0700   P.O. 960 120   I.V. (mL/kg) 2291.4 (18.2)    IV Piggyback 450    Total Intake(mL/kg) 3701.4 (29.4) 120 (1)   Urine (mL/kg/hr) 5025 (1.7) 200 (0.3)   Stool  0   Blood 50    Total Output 5075 200   Net -1373.6 -80        Urine Occurrence  1 x   Stool Occurrence  0 x      LABORATORY DATA: Recent Labs    02/11/21 0412  WBC 17.4*  HGB 13.6  HCT 43.7  PLT 261   Recent Labs    02/11/21 0412  NA 139  K 4.6  CL 105  CO2 28  BUN 14  CREATININE 0.90  GLUCOSE 143*  CALCIUM 9.3   Lab Results  Component Value Date   INR 1.0 01/05/2007    Examination:  General appearance: alert, cooperative, and no  distress Extremities: extremities normal, atraumatic, no cyanosis or edema  Wound Exam: clean, dry, intact   Drainage:  None: wound tissue dry  Motor Exam: Quadriceps and Hamstrings Intact  Sensory Exam: Superficial Peroneal, Deep Peroneal, and Tibial normal   Assessment:    1 Day Post-Op  Procedure(s) (LRB): TOTAL KNEE ARTHROPLASTY (Left)  ADDITIONAL DIAGNOSIS:  Active Problems:   S/P total knee replacement     Plan: Physical Therapy as ordered Weight Bearing as Tolerated (WBAT)  DVT Prophylaxis:  Aspirin  DISCHARGE PLAN: Home  Patient doing well and ready for D/C home      Patient's anticipated LOS is less than 2 midnights, meeting these requirements: - Younger than 43 - Lives within 1 hour of care - Has a competent adult at home to recover with post-op recover - NO history of  - Chronic pain requiring opiods  -  Diabetes  - Coronary Artery Disease  - Heart failure  - Heart attack  - Stroke  - DVT/VTE  - Cardiac arrhythmia  - Respiratory Failure/COPD  - Renal failure  - Anemia  - Advanced Liver disease      Guy Sandifer 02/11/2021, 12:46 PM

## 2021-02-11 NOTE — Plan of Care (Signed)
Instructions were reviewed with patient. All questions were answered. Patient was transported to main entrance by wheelchair. ° °

## 2021-02-11 NOTE — Progress Notes (Addendum)
Physical Therapy Treatment Patient Details Name: Lindsay Gonzalez MRN: [id number] DOB: [birth year] Today's Date: 02/11/2021    History of Present Illness Patient is 55 y.o. female s/p Lt TKA on 02/10/21 with PMH significant for pre-diabetes, Lt achilles tendon repair.    PT Comments    Pt is progressing well with mobility. She ambulated 140' with RW, completed stair training, and demonstrates good understanding of HEP. She is ready to DC home from a PT standpoint.     Follow Up Recommendations  Follow surgeon's recommendation for DC plan and follow-up therapies; HHPT recommended as pt stated her OPPT appointment isn't until 02/20/21.      Equipment Recommendations  None recommended by PT    Recommendations for Other Services       Precautions / Restrictions Precautions Precautions: Fall;Knee Precaution Booklet Issued: Yes (comment) Precaution Comments: reviewed no pillow under knee Restrictions Weight Bearing Restrictions: No Other Position/Activity Restrictions: WBAT    Mobility  Bed Mobility               General bed mobility comments: up in recliner    Transfers Overall transfer level: Needs assistance Equipment used: Rolling walker (2 wheeled) Transfers: Sit to/from Stand Sit to Stand: Supervision         General transfer comment: VCs for hand placement, no physical assist needed  Ambulation/Gait Ambulation/Gait assistance: Supervision Gait Distance (Feet): 140 Feet Assistive device: Rolling walker (2 wheeled) Gait Pattern/deviations: Step-to pattern;Decreased step length - right;Decreased step length - left Gait velocity: WFL   General Gait Details: VCs for sequencing and to roll rather than lift RW, no loss of balance   Stairs Stairs: Yes Stairs assistance: Min guard Stair Management: Two rails;Step to pattern;Forwards Number of Stairs: 5 General stair comments: VCs sequencing, no loss of balance   Wheelchair Mobility    Modified  Rankin (Stroke Patients Only)       Balance Overall balance assessment: Needs assistance Sitting-balance support: Feet supported Sitting balance-Leahy Scale: Good     Standing balance support: During functional activity;Bilateral upper extremity supported Standing balance-Leahy Scale: Fair                              Cognition Arousal/Alertness: Awake/alert Behavior During Therapy: WFL for tasks assessed/performed Overall Cognitive Status: Within Functional Limits for tasks assessed                                        Exercises Total Joint Exercises Ankle Circles/Pumps: AROM;Both;20 reps;Seated Quad Sets: AROM;Left;5 reps;Supine Short Arc Quad: AROM;Left;10 reps;Supine Heel Slides: AAROM;Left;10 reps;Supine Hip ABduction/ADduction: AROM;Left;10 reps;Supine Straight Leg Raises: AROM;AAROM;Left;10 reps;Supine (able to do 2 SLR without assist, used belt as leg lifter for others) Long Arc Quad: AROM;Left;10 reps;Seated Knee Flexion: AAROM;Left;10 reps;Seated Goniometric ROM: 0-60* AAROM L knee    General Comments        Pertinent Vitals/Pain Pain Score: 6  Pain Location: Lt knee Pain Descriptors / Indicators: Aching;Discomfort Pain Intervention(s): Limited activity within patient's tolerance;Monitored during session;Premedicated before session;RN gave pain meds during session;Ice applied    Home Living                      Prior Function            PT Goals (current goals can now be found in the care plan section)  Acute Rehab PT Goals 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, television/film/video PT Goal Formulation: With patient Time For Goal Achievement: 02/17/21 Potential to Achieve Goals: Good Progress towards PT goals: Progressing toward goals    Frequency    7X/week      PT Plan Current plan remains appropriate    Co-evaluation              AM-PAC PT "6 Clicks" Mobility   Outcome Measure  Help needed  turning from your back to your side while in a flat bed without using bedrails?: None Help needed moving from lying on your back to sitting on the side of a flat bed without using bedrails?: A Little Help needed moving to and from a bed to a chair (including a wheelchair)?: A Little Help needed standing up from a chair using your arms (e.g., wheelchair or bedside chair)?: A Little Help needed to walk in hospital room?: None Help needed climbing 3-5 steps with a railing? : A Little 6 Click Score: 20    End of Session Equipment Utilized During Treatment: Gait belt Activity Tolerance: Patient tolerated treatment well Patient left: in chair;with chair alarm set;with call bell/phone within reach Nurse Communication: Mobility status PT Visit Diagnosis: Muscle weakness (generalized) (M62.81);Difficulty in walking, not elsewhere classified (R26.2)     Time: 6226-3335 PT Time Calculation (min) (ACUTE ONLY): 45 min  Charges:  $Gait Training: 8-22 mins $Therapeutic Exercise: 8-22 mins $Therapeutic Activity: 8-22 mins                    Myrikal, Messmer Kistler PT 02/11/2021  Acute Rehabilitation Services Pager [phone] Office [phone]

## 2021-02-11 NOTE — Progress Notes (Signed)
Orthopedic Tech Progress Note Patient Details:  Lindsay Gonzalez May 10, 1966 [id number]  Patient ID: Lindsay Gonzalez, female   DOB: Feb 25, 1966, 55 y.o.   MRN: [id number]  Lindsay Gonzalez CPM 02/11/2021, 7:53 PM

## 2021-02-13 ENCOUNTER — Encounter (HOSPITAL_COMMUNITY): Payer: Self-pay | Admitting: Orthopedic Surgery

## 2021-02-13 NOTE — Anesthesia Postprocedure Evaluation (Signed)
Anesthesia Post Note  Patient: Lindsay Gonzalez  Procedure(s) Performed: TOTAL KNEE ARTHROPLASTY (Left: Knee)     Patient location during evaluation: PACU Anesthesia Type: Regional, MAC and Spinal Level of consciousness: awake and alert Pain management: pain level controlled Vital Signs Assessment: post-procedure vital signs reviewed and stable Respiratory status: spontaneous breathing, nonlabored ventilation, respiratory function stable and patient connected to nasal cannula oxygen Cardiovascular status: blood pressure returned to baseline and stable Postop Assessment: no apparent nausea or vomiting Anesthetic complications: no   No notable events documented.  Last Vitals:  Vitals:   02/11/21 1058 02/11/21 1357  BP: 113/81 128/65  Pulse: 81 84  Resp: 15 14  Temp: 36.7 C 36.7 C  SpO2: 99% 98%    Last Pain:  Vitals:   02/11/21 1629  TempSrc:   PainSc: 6                  Belvin Gauss P Paddy Neis

## 2021-09-14 IMAGING — MG DIGITAL SCREENING BILAT W/ TOMO W/ CAD
8 series · 8 of 24 positions shown · non-contrast
Comparison: None.

CLINICAL DATA: Screening. This is the patient's initial baseline
mammogram.

EXAM:
DIGITAL SCREENING BILATERAL MAMMOGRAM WITH TOMO AND CAD

[R MLO synth-2D]
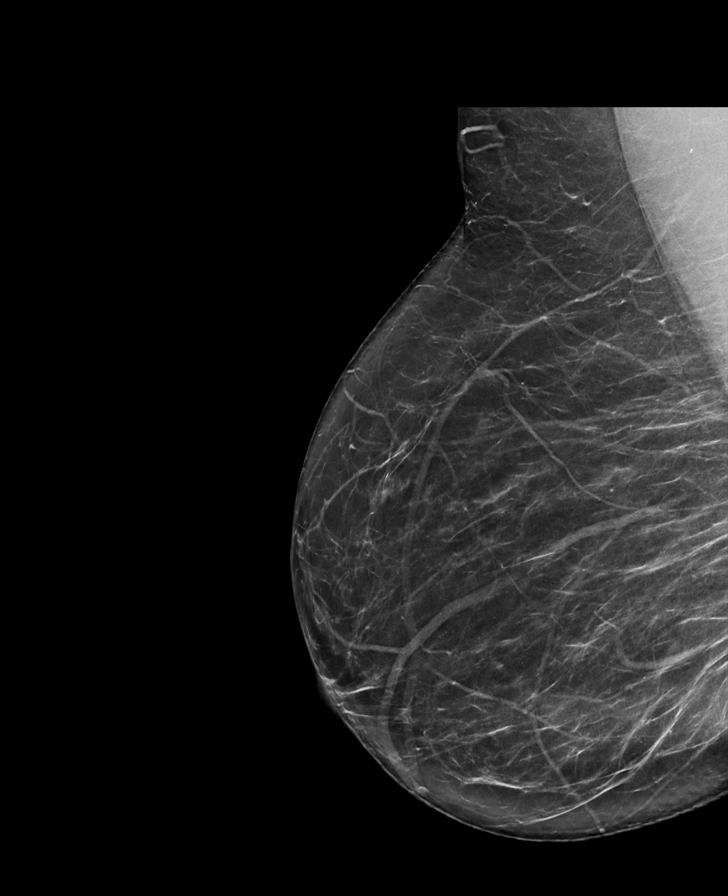

[R CC synth-2D]
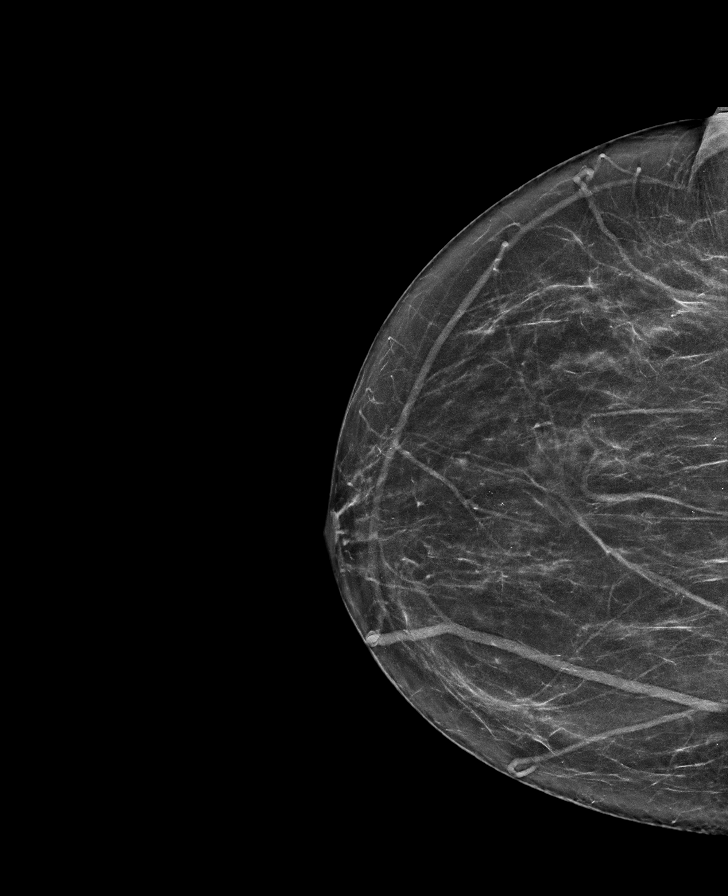

[L CC synth-2D]
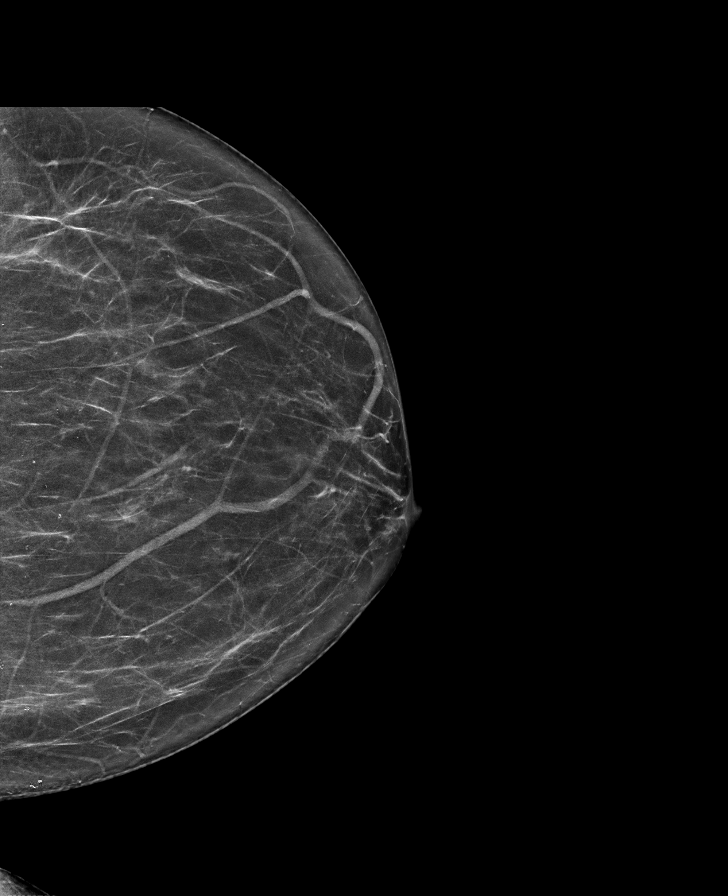

[L MLO synth-2D]
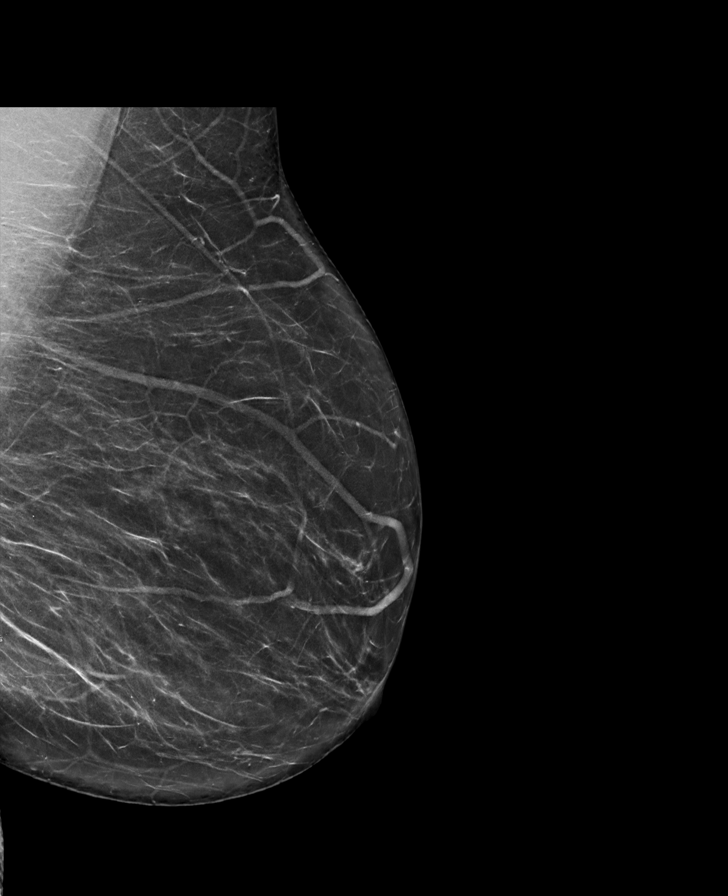

[R CC tomo · tomo slice 34/67.0]
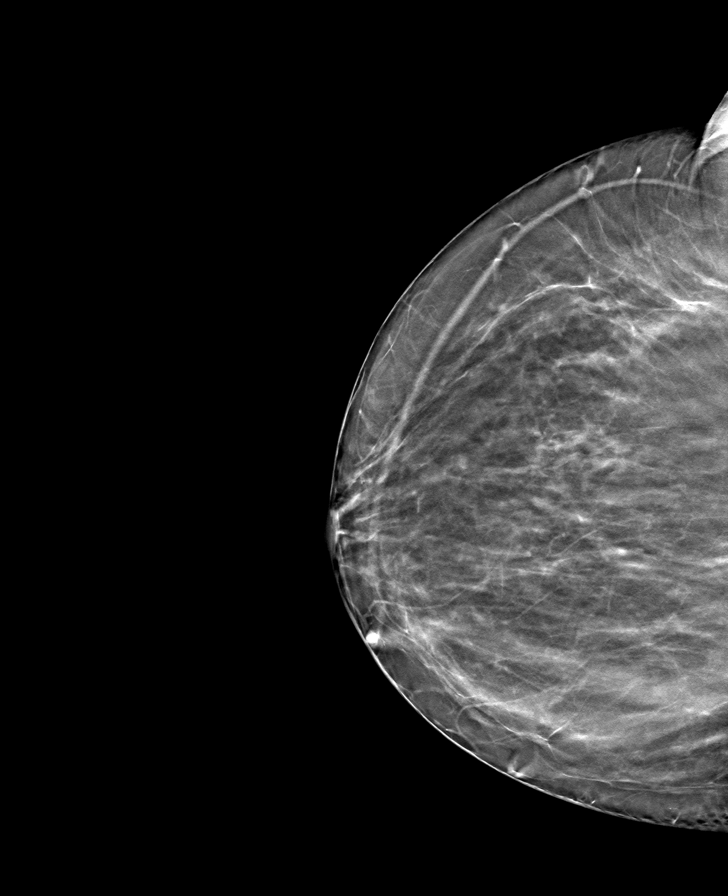

[L CC tomo · tomo slice 35/68.0]
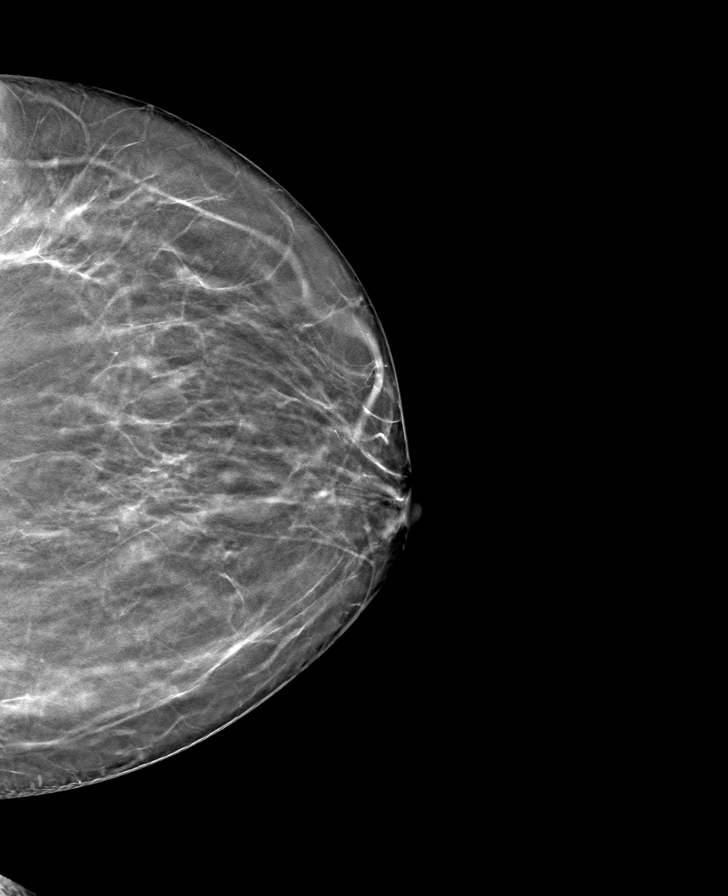

[L MLO tomo · tomo slice 41/81.0]
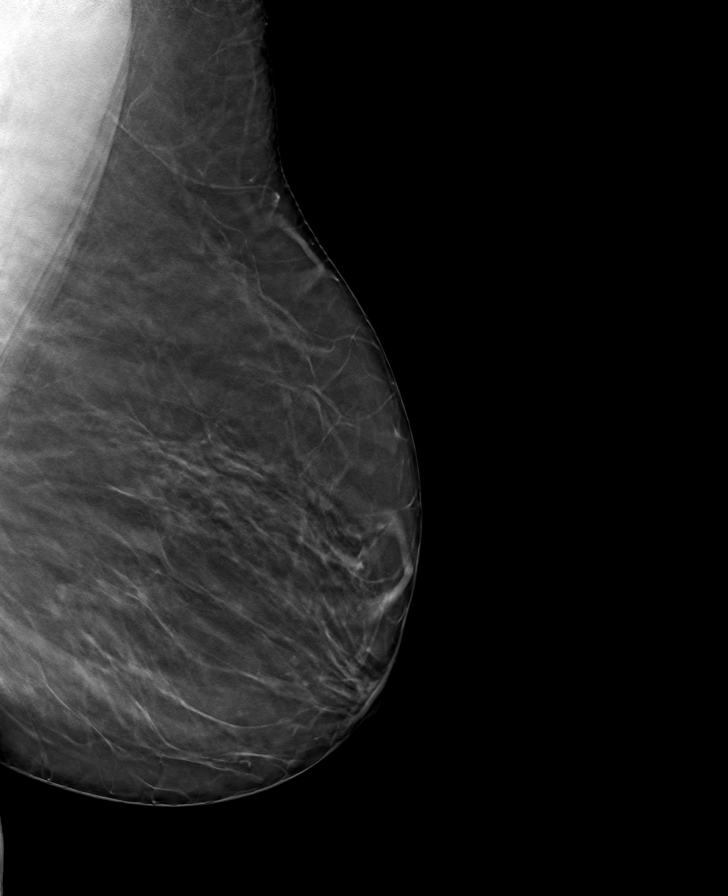

[R MLO tomo · tomo slice 41/81.0]
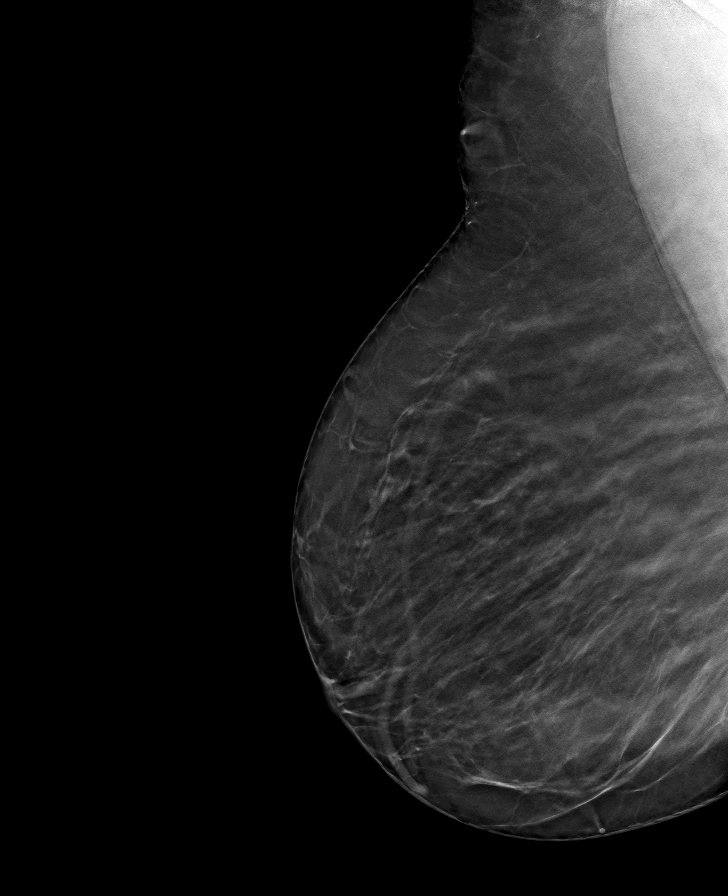

[8 of 24 positions shown; findings below may reference images not displayed]

ACR Breast Density Category b: There are scattered areas of
fibroglandular density.
FINDINGS: There are no findings suspicious for malignancy. Images were
processed with CAD.
IMPRESSION: No mammographic evidence of malignancy. A result letter of this
screening mammogram will be mailed directly to the patient.

RECOMMENDATION:
Screening mammogram in one year. (Code:[id number]-[id number])

BI-RADS CATEGORY  1: Negative.

## 2022-01-28 NOTE — Telephone Encounter (Signed)
Unable to reach patient.

## 2022-09-28 ENCOUNTER — Encounter: Payer: Self-pay | Admitting: *Deleted

## 2022-12-21 ENCOUNTER — Other Ambulatory Visit: Payer: Self-pay

## 2022-12-21 ENCOUNTER — Emergency Department (HOSPITAL_BASED_OUTPATIENT_CLINIC_OR_DEPARTMENT_OTHER)
Admission: EM | Admit: 2022-12-21 | Discharge: 2022-12-21 | Disposition: A | Discharge status: Home / Self Care | Payer: BC Managed Care – PPO | Attending: Emergency Medicine | Admitting: Emergency Medicine

## 2022-12-21 DIAGNOSIS — M5416 Radiculopathy, lumbar region: Secondary | ICD-10-CM | POA: Diagnosis not present

## 2022-12-21 DIAGNOSIS — Z7982 Long term (current) use of aspirin: Secondary | ICD-10-CM | POA: Diagnosis not present

## 2022-12-21 DIAGNOSIS — M545 Low back pain, unspecified: Secondary | ICD-10-CM | POA: Diagnosis present

## 2022-12-21 MED ORDER — ACETAMINOPHEN 500 MG PO TABS: 500.0000 mg | ORAL_TABLET | Freq: Four times a day (QID) | ORAL | 0 refills | Status: AC | PRN

## 2022-12-21 MED ORDER — HYDROCODONE-ACETAMINOPHEN 5-325 MG PO TABS: 1.0000 | ORAL_TABLET | Freq: Two times a day (BID) | ORAL | 0 refills | Status: AC | PRN

## 2022-12-21 MED ORDER — DEXAMETHASONE SODIUM PHOSPHATE 10 MG/ML IJ SOLN
10.0000 mg | Freq: Once | INTRAMUSCULAR | Status: AC
  Administered 2022-12-21: 10 mg via INTRAMUSCULAR
  Filled 2022-12-21: qty 1

## 2022-12-21 MED ORDER — LIDOCAINE 5 % EX PTCH: 1.0000 | MEDICATED_PATCH | CUTANEOUS | 0 refills | Status: AC

## 2022-12-21 MED ORDER — METHOCARBAMOL 500 MG PO TABS: 500.0000 mg | ORAL_TABLET | Freq: Two times a day (BID) | ORAL | 0 refills | Status: AC

## 2022-12-21 MED ORDER — DIAZEPAM 5 MG PO TABS
5.0000 mg | ORAL_TABLET | Freq: Once | ORAL | Status: AC
  Administered 2022-12-21: 5 mg via ORAL
  Filled 2022-12-21: qty 1

## 2022-12-21 MED ORDER — OXYCODONE-ACETAMINOPHEN 5-325 MG PO TABS
1.0000 | ORAL_TABLET | Freq: Once | ORAL | Status: AC
  Administered 2022-12-21: 1 via ORAL
  Filled 2022-12-21: qty 1

## 2022-12-21 MED ORDER — PREDNISONE 10 MG PO TABS: 40.0000 mg | ORAL_TABLET | Freq: Every day | ORAL | 0 refills | Status: AC

## 2022-12-21 NOTE — ED Triage Notes (Signed)
Patient presents to ED via POV from home. Here with back pain that began yesterday after moving furniture. Ambulatory.

## 2022-12-21 NOTE — Discharge Instructions (Addendum)
We saw you in the ER for back pain. Fortunately, our evaluation is not concerning for emergent pathology such as spinal cord compression or infection.   Often the pain is self limiting, you just need time and supportive medications. Take the muscle relaxant as needed, and call neurosurgeon for a follow up.  Focus on massaging and stretching the back for now. Warm compresses help.   Please use the back exercises to strengthen the back muscles and be very careful with activities in future to prevent similar painful events.  Please return to the ER if your pain becomes excruciating or you start developing new numbness, weakness, urinary incontinence (peeing on self without warning), urinary retention (not able to pee despite bladder feeling full), inability to defecate, pins and needles sensation by your ano-genital area.

## 2022-12-21 NOTE — ED Provider Notes (Signed)
[place] [facility] Provider Note   CSN: [id number] Arrival date & time: 12/21/22  1254     History  Chief Complaint  Patient presents with   Back Pain    Lindsay Gonzalez is a 57 y.o. female.  HPI    SUBJECTIVE:  Lindsay Gonzalez is a 57 y.o. female who complains of an injury causing low back pain 1 day(s) ago. The pain is positional with bending or lifting, with radiation down the legs. Mechanism of injury: Moving some furniture. Symptoms have been acute and constant since that time.   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 She used to get shots. She has mostly dealt with her back pain over the last few years. She intermittently has pain in the back, usually similar to what she has right now, but this pain is worse.  There is numbness in the legs. Pt has no weakness, urinary incontinence, urinary retention, bowel incontinence, pins and needle sensation in the perineal area.   Home Medications Prior to Admission medications   Medication Sig Start Date End Date Taking? Authorizing Provider  acetaminophen (TYLENOL) 500 MG tablet Take 1 tablet (500 mg total) by mouth every 6 (six) hours as needed. 12/21/22  Yes Derwood Kaplan, MD  HYDROcodone-acetaminophen (NORCO/VICODIN) 5-325 MG tablet Take 1 tablet by mouth every 12 (twelve) hours as needed. 12/21/22  Yes Derwood Kaplan, MD  methocarbamol (ROBAXIN) 500 MG tablet Take 1 tablet (500 mg total) by mouth 2 (two) times daily. 12/21/22  Yes Leyanna Bittman, MD  predniSONE (DELTASONE) 10 MG tablet Take 4 tablets (40 mg total) by mouth daily. 12/21/22  Yes Derwood Kaplan, MD  aspirin EC 325 MG EC tablet Take 1 tablet (325 mg total) by mouth 2 (two) times daily. 02/11/21   Guy Sandifer, PA  diclofenac Sodium (VOLTAREN) 1 % GEL  Apply 4 g topically 4 (four) times daily. To affected joint. Patient not taking: Reported on 01/24/2021 08/26/20   Myra Rude, MD  gabapentin (NEURONTIN) 300 MG capsule Take 1 capsule (300 mg total) by mouth 3 (three) times daily. 02/11/21   Guy Sandifer, PA  lidocaine (LIDODERM) 5 % Place 1 patch onto the skin daily. Remove & Discard patch within 12 hours or as directed by MD 12/21/22   Derwood Kaplan, MD  LINZESS 145 MCG CAPS capsule Take 145 mcg by mouth daily as needed for constipation. 01/21/21   [provider]      Allergies    Penicillins, Codeine, and Sulfamethoxazole    Review of Systems   Review of Systems  All other systems reviewed and are negative.   Physical Exam Updated Vital Signs BP (!) 145/91   Pulse 91   Temp 97.7 F (36.5 C) (Oral)   Resp 17   Ht 5\' 11"  (1.803 m)   SpO2 98%   BMI 38.77 kg/m  Physical Exam Vitals and nursing note reviewed.  Constitutional:      Appearance: She is well-developed.  HENT:     Head: Atraumatic.  Cardiovascular:     Rate and Rhythm: Normal rate.  Pulmonary:     Effort: Pulmonary effort is normal.  Musculoskeletal:     Cervical back: Normal range of motion and neck supple.     Comments: Redo objective physical exam findings below  Skin:    General: Skin is warm and dry.  Neurological:     Mental Status: She is alert and oriented to person, place, and time.     ED Results / Procedures / Treatments   Labs (all labs ordered are listed, but only abnormal results are displayed) Labs Reviewed - No data to display  EKG None  Radiology No results found.  Procedures Procedures    Medications Ordered in ED Medications  oxyCODONE-acetaminophen (PERCOCET/ROXICET) 5-325 MG per tablet 1 tablet (has no administration in time range)  diazepam (VALIUM) tablet 5 mg (has no administration in time range)  dexamethasone (DECADRON) injection 10 mg (has no administration in time range)    ED Course/ Medical  Decision Making/ A&P                             Medical Decision Making Risk OTC drugs. Prescription drug management.   Final Clinical Impression(s) / ED Diagnoses Final diagnoses:  Lumbar radicular pain    OBJECTIVE: Vital signs as noted above. Patient appears to be in mild to moderate pain, antalgic gait noted. Lumbosacral spine area reveals no local tenderness or mass. Painful and reduced LS ROM noted. Straight leg raise is positive at 30 degrees on left. DTR's, motor strength and sensation normal, including heel and toe gait.  Peripheral pulses are palpable. Lumbar spine X-Ray: not indicated.   Differential diagnosis considered includes: - DJD of the back - Spondylitises/ spondylosis - Sciatica - Spinal cord compression - Conus medullaris - Epidural hematoma - Epidural abscess - Lytic/pathologic fracture - Myelitis - Musculoskeletal pain   ASSESSMENT:  lumbar strain, herniated disc likely at L5-S1, and with radiculopathy  PLAN: For acute pain, rest, intermittent application of cold packs (later, may switch to heat, but do not sleep on heating pad), analgesics and muscle relaxants are recommended. Discussed longer term treatment plan of prn NSAID's and discussed a home back care exercise program with flexion exercise routine. Proper lifting with avoidance of heavy lifting discussed. Consider Physical Therapy and XRay studies if not improving. Call or return to clinic prn if these symptoms worsen or fail to improve as anticipated.  Rx / DC Orders ED Discharge Orders          Ordered    predniSONE (DELTASONE) 10 MG tablet  Daily        12/21/22 1540    acetaminophen (TYLENOL) 500 MG tablet  Every 6 hours PRN        12/21/22 1540    methocarbamol (ROBAXIN) 500 MG tablet  2 times daily        12/21/22 1540    HYDROcodone-acetaminophen (NORCO/VICODIN) 5-325 MG tablet  Every 12 hours PRN        12/21/22 1540    lidocaine (LIDODERM) 5 %  Every 24 hours        12/21/22  1540              Derwood Kaplan, MD 12/21/22 1646

## 2022-12-21 NOTE — ED Notes (Signed)
In to see Pt.   Had Pt. Change into a gown.  Pt. Reports she was moving furniture on yesterday and now her lower back is bothering her.  She reports bilat. Leg pain with off and on numbness and tingling in each leg.  Pt. Is able to walk without difficulty.

## 2023-09-02 ENCOUNTER — Other Ambulatory Visit: Payer: Self-pay | Admitting: Family Medicine

## 2023-09-02 DIAGNOSIS — Z1231 Encounter for screening mammogram for malignant neoplasm of breast: Secondary | ICD-10-CM

## 2023-09-22 ENCOUNTER — Ambulatory Visit
Admission: RE | Admit: 2023-09-22 | Discharge: 2023-09-22 | Disposition: A | Discharge status: Home / Self Care | Source: Ambulatory Visit | Attending: Family Medicine | Admitting: Family Medicine

## 2023-09-22 ENCOUNTER — Ambulatory Visit
Admission: RE | Admit: 2023-09-22 | Discharge: 2023-09-22 | Disposition: A | Discharge status: Home / Self Care | Payer: Self-pay | Source: Ambulatory Visit | Attending: Family Medicine | Admitting: Family Medicine

## 2023-09-22 DIAGNOSIS — Z1231 Encounter for screening mammogram for malignant neoplasm of breast: Secondary | ICD-10-CM

## 2023-09-23 ENCOUNTER — Other Ambulatory Visit: Payer: Self-pay | Admitting: Internal Medicine

## 2023-09-23 DIAGNOSIS — R7989 Other specified abnormal findings of blood chemistry: Secondary | ICD-10-CM

## 2023-11-16 ENCOUNTER — Encounter: Payer: Self-pay | Admitting: Family Medicine

## 2023-12-06 ENCOUNTER — Other Ambulatory Visit: Payer: Self-pay | Admitting: Family Medicine

## 2023-12-06 ENCOUNTER — Other Ambulatory Visit: Payer: Self-pay | Admitting: Internal Medicine

## 2023-12-06 DIAGNOSIS — R7989 Other specified abnormal findings of blood chemistry: Secondary | ICD-10-CM

## 2023-12-06 DIAGNOSIS — I471 Supraventricular tachycardia, unspecified: Secondary | ICD-10-CM

## 2023-12-07 ENCOUNTER — Ambulatory Visit
Admission: RE | Admit: 2023-12-07 | Discharge: 2023-12-07 | Discharge status: Home / Self Care | Source: Ambulatory Visit | Attending: Family Medicine | Admitting: Family Medicine

## 2023-12-07 ENCOUNTER — Ambulatory Visit
Admission: RE | Admit: 2023-12-07 | Discharge: 2023-12-07 | Disposition: A | Discharge status: Home / Self Care | Source: Ambulatory Visit | Attending: Internal Medicine | Admitting: Internal Medicine

## 2023-12-07 DIAGNOSIS — I471 Supraventricular tachycardia, unspecified: Secondary | ICD-10-CM

## 2023-12-07 DIAGNOSIS — R7989 Other specified abnormal findings of blood chemistry: Secondary | ICD-10-CM
